# Patient Record
Sex: Female | Born: 1955 | Race: White | Hispanic: No | Marital: Married | Smoking: Former smoker
Health system: Southern US, Community
[De-identification: ages and names within clinical notes are randomized; demographics above are authoritative.]

## PROBLEM LIST (undated history)

## (undated) DIAGNOSIS — E785 Hyperlipidemia, unspecified: Secondary | ICD-10-CM

## (undated) DIAGNOSIS — T7840XA Allergy, unspecified, initial encounter: Secondary | ICD-10-CM

## (undated) DIAGNOSIS — E079 Disorder of thyroid, unspecified: Secondary | ICD-10-CM

## (undated) HISTORY — DX: Allergy, unspecified, initial encounter: T78.40XA

## (undated) HISTORY — PX: HAMMER TOE SURGERY: SHX385

## (undated) HISTORY — DX: Hyperlipidemia, unspecified: E78.5

## (undated) HISTORY — PX: EYE SURGERY: SHX253

## (undated) HISTORY — PX: OTHER SURGICAL HISTORY: SHX169

## (undated) HISTORY — PX: TUBAL LIGATION: SHX77

## (undated) HISTORY — DX: Disorder of thyroid, unspecified: E07.9

---

## 1998-10-11 ENCOUNTER — Other Ambulatory Visit: Admission: RE | Admit: 1998-10-11 | Discharge: 1998-10-11 | Payer: Self-pay | Admitting: Family Medicine

## 1998-11-09 ENCOUNTER — Other Ambulatory Visit: Admission: RE | Admit: 1998-11-09 | Discharge: 1998-11-09 | Payer: Self-pay

## 2000-01-31 ENCOUNTER — Other Ambulatory Visit: Admission: RE | Admit: 2000-01-31 | Discharge: 2000-01-31 | Payer: Self-pay | Admitting: Family Medicine

## 2001-02-23 ENCOUNTER — Other Ambulatory Visit: Admission: RE | Admit: 2001-02-23 | Discharge: 2001-02-23 | Payer: Self-pay | Admitting: Family Medicine

## 2002-04-12 ENCOUNTER — Other Ambulatory Visit: Admission: RE | Admit: 2002-04-12 | Discharge: 2002-04-12 | Payer: Self-pay | Admitting: Family Medicine

## 2003-04-25 ENCOUNTER — Other Ambulatory Visit: Admission: RE | Admit: 2003-04-25 | Discharge: 2003-04-25 | Payer: Self-pay | Admitting: Family Medicine

## 2003-08-21 ENCOUNTER — Other Ambulatory Visit: Admission: RE | Admit: 2003-08-21 | Discharge: 2003-08-21 | Payer: Self-pay | Admitting: Dermatology

## 2004-06-04 ENCOUNTER — Other Ambulatory Visit: Admission: RE | Admit: 2004-06-04 | Discharge: 2004-06-04 | Payer: Self-pay | Admitting: Family Medicine

## 2005-07-01 ENCOUNTER — Other Ambulatory Visit: Admission: RE | Admit: 2005-07-01 | Discharge: 2005-07-01 | Payer: Self-pay | Admitting: Family Medicine

## 2006-07-07 ENCOUNTER — Other Ambulatory Visit: Admission: RE | Admit: 2006-07-07 | Discharge: 2006-07-07 | Payer: Self-pay | Admitting: Family Medicine

## 2013-03-08 ENCOUNTER — Telehealth: Payer: Self-pay | Admitting: Nurse Practitioner

## 2013-03-08 DIAGNOSIS — E785 Hyperlipidemia, unspecified: Secondary | ICD-10-CM

## 2013-03-08 DIAGNOSIS — E039 Hypothyroidism, unspecified: Secondary | ICD-10-CM

## 2013-03-08 NOTE — Telephone Encounter (Signed)
Lab appt made/ orders in for 03/30/13 labs per mmm/jhb

## 2013-03-11 ENCOUNTER — Encounter: Payer: Self-pay | Admitting: Family Medicine

## 2013-03-30 ENCOUNTER — Other Ambulatory Visit (INDEPENDENT_AMBULATORY_CARE_PROVIDER_SITE_OTHER): Payer: BC Managed Care – PPO

## 2013-03-30 DIAGNOSIS — E039 Hypothyroidism, unspecified: Secondary | ICD-10-CM

## 2013-03-30 DIAGNOSIS — E785 Hyperlipidemia, unspecified: Secondary | ICD-10-CM

## 2013-03-30 LAB — COMPLETE METABOLIC PANEL WITH GFR
ALT: 11 U/L (ref 0–35)
AST: 19 U/L (ref 0–37)
Albumin: 4.4 g/dL (ref 3.5–5.2)
Alkaline Phosphatase: 57 U/L (ref 39–117)
Calcium: 9.7 mg/dL (ref 8.4–10.5)
Chloride: 105 mEq/L (ref 96–112)
Creat: 0.87 mg/dL (ref 0.50–1.10)
Potassium: 4.3 mEq/L (ref 3.5–5.3)

## 2013-03-31 LAB — NMR LIPOPROFILE WITH LIPIDS
Cholesterol, Total: 202 mg/dL — ABNORMAL HIGH (ref <200)
HDL Particle Number: 42 umol/L (ref >=30.5)
LDL (calc): 128 mg/dL — ABNORMAL HIGH (ref <100)
LP-IR Score: 37 (ref <=45)
Small LDL Particle Number: 567 nmol/L — ABNORMAL HIGH (ref <=527)
Triglycerides: 57 mg/dL (ref <150)

## 2013-04-01 NOTE — Progress Notes (Signed)
Patient came in for labs only.

## 2013-04-04 ENCOUNTER — Ambulatory Visit (INDEPENDENT_AMBULATORY_CARE_PROVIDER_SITE_OTHER): Payer: BC Managed Care – PPO | Admitting: Nurse Practitioner

## 2013-04-04 ENCOUNTER — Encounter: Payer: Self-pay | Admitting: Nurse Practitioner

## 2013-04-04 VITALS — BP 109/55 | HR 61 | Temp 98.1°F | Ht 69.0 in | Wt 190.0 lb

## 2013-04-04 DIAGNOSIS — J309 Allergic rhinitis, unspecified: Secondary | ICD-10-CM | POA: Insufficient documentation

## 2013-04-04 DIAGNOSIS — E039 Hypothyroidism, unspecified: Secondary | ICD-10-CM

## 2013-04-04 DIAGNOSIS — K219 Gastro-esophageal reflux disease without esophagitis: Secondary | ICD-10-CM

## 2013-04-04 DIAGNOSIS — E785 Hyperlipidemia, unspecified: Secondary | ICD-10-CM

## 2013-04-04 MED ORDER — PITAVASTATIN CALCIUM 2 MG PO TABS: 2.0000 mg | ORAL_TABLET | Freq: Every day | ORAL | Status: DC

## 2013-04-04 NOTE — Progress Notes (Signed)
  Subjective:    Patient ID: Melissa Gilbert, female    DOB: Feb 13, 1956, 57 y.o.   MRN: [id number]  Hyperlipidemia This is a chronic problem. The current episode started more than 1 year ago. The problem is controlled. Recent lipid tests were reviewed and are normal. There are no known factors aggravating her hyperlipidemia. Pertinent negatives include no chest pain. She is currently on no antihyperlipidemic treatment. The current treatment provides significant improvement of lipids. There are no compliance problems.   Hypertension This is a chronic problem. The current episode started more than 1 year ago. The problem is unchanged. The problem is controlled. Pertinent negatives include no blurred vision, chest pain, headaches, malaise/fatigue, palpitations, peripheral edema or sweats. There are no associated agents to hypertension. Risk factors for coronary artery disease include dyslipidemia. Past treatments include nothing. The current treatment provides significant improvement. There are no compliance problems.   HYpothyroidism Levothyroxin . No C/O fatigue   Review of Systems  Constitutional: Negative for malaise/fatigue.  Eyes: Negative for blurred vision.  Cardiovascular: Negative for chest pain and palpitations.  Neurological: Negative for headaches.  All other systems reviewed and are negative.       Objective:   Physical Exam  Constitutional: She is oriented to person, place, and time. She appears well-developed and well-nourished.  HENT:  Nose: Nose normal.  Mouth/Throat: Oropharynx is clear and moist.  Eyes: EOM are normal.  Neck: Trachea normal, normal range of motion and full passive range of motion without pain. Neck supple. No JVD present. Carotid bruit is not present. No thyromegaly present.  Cardiovascular: Normal rate, regular rhythm, normal heart sounds and intact distal pulses.  Exam reveals no gallop and no friction rub.   No murmur heard. Pulmonary/Chest:  Effort normal and breath sounds normal.  Abdominal: Soft. Bowel sounds are normal. She exhibits no distension and no mass. There is no tenderness.  Musculoskeletal: Normal range of motion.  Lymphadenopathy:    She has no cervical adenopathy.  Neurological: She is alert and oriented to person, place, and time. She has normal reflexes.  Skin: Skin is warm and dry.  Psychiatric: She has a normal mood and affect. Her behavior is normal. Judgment and thought content normal.  BP 109/55  Pulse 61  Temp(Src) 98.1 F (36.7 C) (Oral)  Ht 5\' 9"  (1.753 m)  Wt 190 lb (86.183 kg)  BMI 28.05 kg/m2         Assessment & Plan:  1. Hypothyroidism Contine Levothyroxin as Rx  2. Hyperlipidemia Low fat diet and exercise - Pitavastatin Calcium (LIVALO) 2 MG TABS; Take 1 tablet (2 mg total) by mouth daily.  Dispense: 90 tablet; Refill: 1  3. GERD (gastroesophageal reflux disease) Do not eat 2 hours prior to bedtime  4. Allergic rhinitis Meds as needed Avoid allergens Labs reviewed at appointment Mary-Margaret Daphine Deutscher, FNP

## 2013-04-04 NOTE — Patient Instructions (Addendum)

## 2013-05-11 ENCOUNTER — Other Ambulatory Visit: Payer: Self-pay | Admitting: Nurse Practitioner

## 2013-05-11 ENCOUNTER — Telehealth: Payer: Self-pay | Admitting: Nurse Practitioner

## 2013-05-11 MED ORDER — LEVOTHYROXINE SODIUM 125 MCG PO TABS: 125.0000 ug | ORAL_TABLET | Freq: Every day | ORAL | Status: DC

## 2013-05-11 NOTE — Telephone Encounter (Signed)
Will do refill but nTBS in next 2 months to repeat labs

## 2013-05-11 NOTE — Telephone Encounter (Signed)
Detailed message left that rx sent to pharmacy and that she will need to be seen in 2 months for labs and follow up

## 2013-08-18 ENCOUNTER — Ambulatory Visit (INDEPENDENT_AMBULATORY_CARE_PROVIDER_SITE_OTHER): Payer: BC Managed Care – PPO | Admitting: Nurse Practitioner

## 2013-08-18 ENCOUNTER — Telehealth: Payer: Self-pay | Admitting: Nurse Practitioner

## 2013-08-18 ENCOUNTER — Encounter: Payer: Self-pay | Admitting: Nurse Practitioner

## 2013-08-18 VITALS — BP 107/52 | HR 62 | Temp 97.3°F | Ht 69.5 in | Wt 202.0 lb

## 2013-08-18 DIAGNOSIS — E039 Hypothyroidism, unspecified: Secondary | ICD-10-CM

## 2013-08-18 DIAGNOSIS — E785 Hyperlipidemia, unspecified: Secondary | ICD-10-CM

## 2013-08-18 DIAGNOSIS — N390 Urinary tract infection, site not specified: Secondary | ICD-10-CM

## 2013-08-18 LAB — POCT UA - MICROSCOPIC ONLY
Casts, Ur, LPF, POC: NEGATIVE
Crystals, Ur, HPF, POC: NEGATIVE
Yeast, UA: NEGATIVE

## 2013-08-18 MED ORDER — PITAVASTATIN CALCIUM 2 MG PO TABS: 2.0000 mg | ORAL_TABLET | Freq: Every day | ORAL | Status: DC

## 2013-08-18 MED ORDER — LEVOTHYROXINE SODIUM 125 MCG PO TABS: 125.0000 ug | ORAL_TABLET | Freq: Every day | ORAL | Status: DC

## 2013-08-18 MED ORDER — CIPROFLOXACIN HCL 500 MG PO TABS: 500.0000 mg | ORAL_TABLET | Freq: Two times a day (BID) | ORAL | Status: DC

## 2013-08-18 NOTE — Telephone Encounter (Signed)
APPT MADE

## 2013-08-18 NOTE — Patient Instructions (Signed)
Urinary Tract Infection  Urinary tract infections (UTIs) can develop anywhere along your urinary tract. Your urinary tract is your body's drainage system for removing wastes and extra water. Your urinary tract includes two kidneys, two ureters, a bladder, and a urethra. Your kidneys are a pair of bean-shaped organs. Each kidney is about the size of your fist. They are located below your ribs, one on each side of your spine.  CAUSES  Infections are caused by microbes, which are microscopic organisms, including fungi, viruses, and bacteria. These organisms are so small that they can only be seen through a microscope. Bacteria are the microbes that most commonly cause UTIs.  SYMPTOMS   Symptoms of UTIs may vary by age and gender of the patient and by the location of the infection. Symptoms in young women typically include a frequent and intense urge to urinate and a painful, burning feeling in the bladder or urethra during urination. Older women and men are more likely to be tired, shaky, and weak and have muscle aches and abdominal pain. A fever may mean the infection is in your kidneys. Other symptoms of a kidney infection include pain in your back or sides below the ribs, nausea, and vomiting.  DIAGNOSIS  To diagnose a UTI, your caregiver will ask you about your symptoms. Your caregiver also will ask to provide a urine sample. The urine sample will be tested for bacteria and white blood cells. White blood cells are made by your body to help fight infection.  TREATMENT   Typically, UTIs can be treated with medication. Because most UTIs are caused by a bacterial infection, they usually can be treated with the use of antibiotics. The choice of antibiotic and length of treatment depend on your symptoms and the type of bacteria causing your infection.  HOME CARE INSTRUCTIONS   If you were prescribed antibiotics, take them exactly as your caregiver instructs you. Finish the medication even if you feel better after you  have only taken some of the medication.   Drink enough water and fluids to keep your urine clear or pale yellow.   Avoid caffeine, tea, and carbonated beverages. They tend to irritate your bladder.   Empty your bladder often. Avoid holding urine for long periods of time.   Empty your bladder before and after sexual intercourse.   After a bowel movement, women should cleanse from front to back. Use each tissue only once.  SEEK MEDICAL CARE IF:    You have back pain.   You develop a fever.   Your symptoms do not begin to resolve within 3 days.  SEEK IMMEDIATE MEDICAL CARE IF:    You have severe back pain or lower abdominal pain.   You develop chills.   You have nausea or vomiting.   You have continued burning or discomfort with urination.  MAKE SURE YOU:    Understand these instructions.   Will watch your condition.   Will get help right away if you are not doing well or get worse.  Document Released: 08/27/2005 Document Revised: 05/18/2012 Document Reviewed: 12/26/2011  [employer] Patient Information 2014 [employer].

## 2013-08-18 NOTE — Progress Notes (Signed)
  Subjective:    Patient ID: MERELIN HUMAN, female    DOB: 1956-01-20, 57 y.o.   MRN: [id number]  Urinary Tract Infection  This is a new problem. The current episode started yesterday. The problem occurs every urination. The problem has been waxing and waning. The quality of the pain is described as burning. The pain is at a severity of 5/10. The pain is mild. There has been no fever. She is sexually active. There is no history of pyelonephritis. Associated symptoms include frequency, hesitancy and urgency. Pertinent negatives include no chills, flank pain or vomiting. Treatments tried: AZO OTC. The treatment provided mild relief.      Review of Systems  Constitutional: Negative for chills.  Gastrointestinal: Negative for vomiting.  Genitourinary: Positive for hesitancy, urgency and frequency. Negative for flank pain.  All other systems reviewed and are negative.       Objective:   Physical Exam  Constitutional: She appears well-developed and well-nourished.  Cardiovascular: Normal rate, regular rhythm and normal heart sounds.   Pulmonary/Chest: Effort normal and breath sounds normal.  Abdominal: Soft. Bowel sounds are normal. There is no tenderness.  Genitourinary:  No CVA tenderness   BP 107/52  Pulse 62  Temp(Src) 97.3 F (36.3 C) (Oral)  Ht 5' 9.5" (1.765 m)  Wt 202 lb (91.627 kg)  BMI 29.41 kg/m2         Assessment & Plan:  1. UTI (urinary tract infection) Force fluids - POCT UA - Microscopic Only - POCT urinalysis dipstick - ciprofloxacin (CIPRO) 500 MG tablet; Take 1 tablet (500 mg total) by mouth 2 (two) times daily.  Dispense: 10 tablet; Refill: 0  2. Hyperlipidemia Low fat diet and exercise - Pitavastatin Calcium (LIVALO) 2 MG TABS; Take 1 tablet (2 mg total) by mouth daily.  Dispense: 90 tablet; Refill: 1  3. Hypothyroidism  - levothyroxine (SYNTHROID, LEVOTHROID) 125 MCG tablet; Take 1 tablet (125 mcg total) by mouth daily before breakfast.  Dispense:  30 tablet; Refill: 2  Mary-Margaret Daphine Deutscher, FNP

## 2013-09-26 ENCOUNTER — Other Ambulatory Visit: Payer: Self-pay | Admitting: Nurse Practitioner

## 2013-09-26 ENCOUNTER — Encounter (INDEPENDENT_AMBULATORY_CARE_PROVIDER_SITE_OTHER): Payer: Self-pay

## 2013-09-26 ENCOUNTER — Other Ambulatory Visit (INDEPENDENT_AMBULATORY_CARE_PROVIDER_SITE_OTHER): Payer: BC Managed Care – PPO

## 2013-09-26 DIAGNOSIS — E039 Hypothyroidism, unspecified: Secondary | ICD-10-CM

## 2013-09-26 DIAGNOSIS — E785 Hyperlipidemia, unspecified: Secondary | ICD-10-CM

## 2013-09-27 LAB — CMP14+EGFR
ALT: 11 IU/L (ref 0–32)
Albumin/Globulin Ratio: 2.6 — ABNORMAL HIGH (ref 1.1–2.5)
Albumin: 4.5 g/dL (ref 3.5–5.5)
Alkaline Phosphatase: 70 IU/L (ref 39–117)
BUN/Creatinine Ratio: 16 (ref 9–23)
Chloride: 103 mmol/L (ref 97–108)
GFR calc Af Amer: 85 mL/min/{1.73_m2} (ref >59)
GFR calc non Af Amer: 74 mL/min/{1.73_m2} (ref >59)
Potassium: 4.5 mmol/L (ref 3.5–5.2)
Total Bilirubin: 0.2 mg/dL (ref 0.0–1.2)
Total Protein: 6.2 g/dL (ref 6.0–8.5)

## 2013-09-27 LAB — NMR, LIPOPROFILE
Cholesterol: 184 mg/dL (ref <200)
LDL Particle Number: 1298 nmol/L — ABNORMAL HIGH (ref <1000)
LDL Size: 20.6 nm (ref >20.5)
LP-IR Score: 38 (ref <=45)
Small LDL Particle Number: 616 nmol/L — ABNORMAL HIGH (ref <=527)
Triglycerides by NMR: 63 mg/dL (ref <150)

## 2013-09-27 LAB — THYROID PANEL WITH TSH
Free Thyroxine Index: 3 (ref 1.2–4.9)
TSH: 4.21 u[IU]/mL (ref 0.450–4.500)

## 2013-09-29 NOTE — Progress Notes (Signed)
Patient came in for labs only.

## 2013-09-30 ENCOUNTER — Encounter: Payer: Self-pay | Admitting: Nurse Practitioner

## 2013-09-30 ENCOUNTER — Ambulatory Visit (INDEPENDENT_AMBULATORY_CARE_PROVIDER_SITE_OTHER): Payer: BC Managed Care – PPO | Admitting: Nurse Practitioner

## 2013-09-30 ENCOUNTER — Telehealth: Payer: Self-pay | Admitting: Nurse Practitioner

## 2013-09-30 VITALS — BP 108/52 | HR 61 | Temp 97.2°F | Wt 198.0 lb

## 2013-09-30 DIAGNOSIS — R350 Frequency of micturition: Secondary | ICD-10-CM

## 2013-09-30 DIAGNOSIS — N390 Urinary tract infection, site not specified: Secondary | ICD-10-CM

## 2013-09-30 DIAGNOSIS — IMO0001 Reserved for inherently not codable concepts without codable children: Secondary | ICD-10-CM

## 2013-09-30 LAB — POCT UA - MICROSCOPIC ONLY
Casts, Ur, LPF, POC: NEGATIVE
Crystals, Ur, HPF, POC: NEGATIVE
Yeast, UA: NEGATIVE

## 2013-09-30 LAB — POCT URINALYSIS DIPSTICK
Bilirubin, UA: NEGATIVE
Glucose, UA: NEGATIVE
Ketones, UA: NEGATIVE
Spec Grav, UA: 1.01

## 2013-09-30 MED ORDER — NITROFURANTOIN MONOHYD MACRO 100 MG PO CAPS: 100.0000 mg | ORAL_CAPSULE | Freq: Two times a day (BID) | ORAL | Status: DC

## 2013-09-30 NOTE — Progress Notes (Signed)
  Subjective:    Patient ID: Melissa Gilbert, female    DOB: 1956-09-20, 57 y.o.   MRN: [id number]  Urinary Tract Infection  This is a new problem. The current episode started in the past 7 days. Episode frequency: dysuria. The problem has been gradually worsening. The quality of the pain is described as aching and burning. The pain is at a severity of 5/10. The pain is mild. There has been no fever. The fever has been present for 1 - 2 days. She is sexually active. There is no history of pyelonephritis. Associated symptoms include frequency and urgency. Pertinent negatives include no possible pregnancy. She has tried nothing for the symptoms.      Review of Systems  Genitourinary: Positive for urgency and frequency.  All other systems reviewed and are negative.       Objective:   Physical Exam  Constitutional: She appears well-developed and well-nourished.  Cardiovascular: Normal rate, regular rhythm and normal heart sounds.   Pulmonary/Chest: Effort normal and breath sounds normal.  Abdominal: Soft. Bowel sounds are normal.  Genitourinary:  No CvA tenderness   BP 108/52  Pulse 61  Temp(Src) 97.2 F (36.2 C) (Oral)  Wt 198 lb (89.812 kg)  BMI 28.83 kg/m2         Assessment & Plan:   1. Frequency   2. UTI (urinary tract infection)    Orders Placed This Encounter  Procedures  . POCT urinalysis dipstick  . POCT UA - Microscopic Only   Meds ordered this encounter  Medications  . nitrofurantoin, macrocrystal-monohydrate, (MACROBID) 100 MG capsule    Sig: Take 1 capsule (100 mg total) by mouth 2 (two) times daily.    Dispense:  14 capsule    Refill:  1    Order Specific Question:  Supervising Provider    Answer:  Ernestina Penna [[id number]]   Force fluids AZO OTC as needed  Mary-Margaret Daphine Deutscher, FNP

## 2013-09-30 NOTE — Patient Instructions (Signed)
Urinary Tract Infection  Urinary tract infections (UTIs) can develop anywhere along your urinary tract. Your urinary tract is your body's drainage system for removing wastes and extra water. Your urinary tract includes two kidneys, two ureters, a bladder, and a urethra. Your kidneys are a pair of bean-shaped organs. Each kidney is about the size of your fist. They are located below your ribs, one on each side of your spine.  CAUSES  Infections are caused by microbes, which are microscopic organisms, including fungi, viruses, and bacteria. These organisms are so small that they can only be seen through a microscope. Bacteria are the microbes that most commonly cause UTIs.  SYMPTOMS   Symptoms of UTIs may vary by age and gender of the patient and by the location of the infection. Symptoms in young women typically include a frequent and intense urge to urinate and a painful, burning feeling in the bladder or urethra during urination. Older women and men are more likely to be tired, shaky, and weak and have muscle aches and abdominal pain. A fever may mean the infection is in your kidneys. Other symptoms of a kidney infection include pain in your back or sides below the ribs, nausea, and vomiting.  DIAGNOSIS  To diagnose a UTI, your caregiver will ask you about your symptoms. Your caregiver also will ask to provide a urine sample. The urine sample will be tested for bacteria and white blood cells. White blood cells are made by your body to help fight infection.  TREATMENT   Typically, UTIs can be treated with medication. Because most UTIs are caused by a bacterial infection, they usually can be treated with the use of antibiotics. The choice of antibiotic and length of treatment depend on your symptoms and the type of bacteria causing your infection.  HOME CARE INSTRUCTIONS   If you were prescribed antibiotics, take them exactly as your caregiver instructs you. Finish the medication even if you feel better after you  have only taken some of the medication.   Drink enough water and fluids to keep your urine clear or pale yellow.   Avoid caffeine, tea, and carbonated beverages. They tend to irritate your bladder.   Empty your bladder often. Avoid holding urine for long periods of time.   Empty your bladder before and after sexual intercourse.   After a bowel movement, women should cleanse from front to back. Use each tissue only once.  SEEK MEDICAL CARE IF:    You have back pain.   You develop a fever.   Your symptoms do not begin to resolve within 3 days.  SEEK IMMEDIATE MEDICAL CARE IF:    You have severe back pain or lower abdominal pain.   You develop chills.   You have nausea or vomiting.   You have continued burning or discomfort with urination.  MAKE SURE YOU:    Understand these instructions.   Will watch your condition.   Will get help right away if you are not doing well or get worse.  Document Released: 08/27/2005 Document Revised: 05/18/2012 Document Reviewed: 12/26/2011  [employer] Patient Information 2014 [employer].

## 2013-09-30 NOTE — Telephone Encounter (Signed)
appt scheduled for UTI

## 2013-10-03 ENCOUNTER — Telehealth: Payer: Self-pay | Admitting: Nurse Practitioner

## 2013-10-03 DIAGNOSIS — E785 Hyperlipidemia, unspecified: Secondary | ICD-10-CM

## 2013-10-04 MED ORDER — PITAVASTATIN CALCIUM 2 MG PO TABS: 2.0000 mg | ORAL_TABLET | Freq: Every day | ORAL | Status: DC

## 2013-10-04 NOTE — Telephone Encounter (Signed)
rx sent to pharmacy

## 2013-10-05 ENCOUNTER — Ambulatory Visit: Payer: BC Managed Care – PPO | Admitting: Nurse Practitioner

## 2013-10-05 NOTE — Telephone Encounter (Signed)
Aware. 

## 2013-11-16 ENCOUNTER — Other Ambulatory Visit: Payer: Self-pay | Admitting: Nurse Practitioner

## 2014-04-15 ENCOUNTER — Other Ambulatory Visit: Payer: Self-pay | Admitting: Nurse Practitioner

## 2014-04-17 ENCOUNTER — Telehealth: Payer: Self-pay | Admitting: Nurse Practitioner

## 2014-04-17 ENCOUNTER — Other Ambulatory Visit: Payer: Self-pay | Admitting: *Deleted

## 2014-04-17 DIAGNOSIS — E785 Hyperlipidemia, unspecified: Secondary | ICD-10-CM

## 2014-04-17 MED ORDER — PITAVASTATIN CALCIUM 2 MG PO TABS: 2.0000 mg | ORAL_TABLET | Freq: Every day | ORAL | Status: DC

## 2014-04-17 NOTE — Telephone Encounter (Signed)
It will be ok to wait until seen for refill

## 2014-04-17 NOTE — Telephone Encounter (Signed)
Patient NTBS for follow up and lab work For refill

## 2014-04-17 NOTE — Telephone Encounter (Signed)
Last seen and last lipids 10/14  MMM

## 2014-04-18 NOTE — Telephone Encounter (Signed)
Patient aware.

## 2014-04-20 ENCOUNTER — Ambulatory Visit (INDEPENDENT_AMBULATORY_CARE_PROVIDER_SITE_OTHER): Payer: BC Managed Care – PPO | Admitting: Nurse Practitioner

## 2014-04-20 ENCOUNTER — Encounter: Payer: Self-pay | Admitting: Nurse Practitioner

## 2014-04-20 VITALS — BP 101/63 | HR 70 | Temp 99.1°F | Ht 69.5 in | Wt 200.4 lb

## 2014-04-20 DIAGNOSIS — K219 Gastro-esophageal reflux disease without esophagitis: Secondary | ICD-10-CM

## 2014-04-20 DIAGNOSIS — E785 Hyperlipidemia, unspecified: Secondary | ICD-10-CM

## 2014-04-20 DIAGNOSIS — E039 Hypothyroidism, unspecified: Secondary | ICD-10-CM

## 2014-04-20 MED ORDER — LEVOTHYROXINE SODIUM 125 MCG PO TABS: ORAL_TABLET | ORAL | Status: DC

## 2014-04-20 MED ORDER — PITAVASTATIN CALCIUM 2 MG PO TABS: 2.0000 mg | ORAL_TABLET | Freq: Every day | ORAL | Status: DC

## 2014-04-20 NOTE — Progress Notes (Signed)
  Subjective:    Patient ID: Melissa Gilbert, female    DOB: December 10, 1955, 58 y.o.   MRN: [id number]  Patient here today for follow up of chronic medical problems. No complaints.  Hyperlipidemia This is a chronic problem. The current episode started more than 1 year ago. The problem is controlled. Recent lipid tests were reviewed and are normal. There are no known factors aggravating her hyperlipidemia. She is currently on no antihyperlipidemic treatment. The current treatment provides significant improvement of lipids. There are no compliance problems.   HYpothyroidism Levothyroxin 146mcg. No C/O fatigue   Review of Systems  All other systems reviewed and are negative.      Objective:   Physical Exam  Constitutional: She is oriented to person, place, and time. She appears well-developed and well-nourished.  HENT:  Nose: Nose normal.  Mouth/Throat: Oropharynx is clear and moist.  Eyes: EOM are normal.  Neck: Trachea normal, normal range of motion and full passive range of motion without pain. Neck supple. No JVD present. Carotid bruit is not present. No thyromegaly present.  Cardiovascular: Normal rate, regular rhythm, normal heart sounds and intact distal pulses.  Exam reveals no gallop and no friction rub.   No murmur heard. Pulmonary/Chest: Effort normal and breath sounds normal.  Abdominal: Soft. Bowel sounds are normal. She exhibits no distension and no mass. There is no tenderness.  Musculoskeletal: Normal range of motion.  Lymphadenopathy:    She has no cervical adenopathy.  Neurological: She is alert and oriented to person, place, and time. She has normal reflexes.  Skin: Skin is warm and dry.  Psychiatric: She has a normal mood and affect. Her behavior is normal. Judgment and thought content normal.  BP 101/63  Pulse 70  Temp(Src) 99.1 F (37.3 C) (Oral)  Ht 5' 9.5" (1.765 m)  Wt 200 lb 6.4 oz (90.901 kg)  BMI 29.18 kg/m2         Assessment & Plan:   1.  Hypothyroidism   2. Hyperlipidemia   3. GERD (gastroesophageal reflux disease)    Orders Placed This Encounter  Procedures  . CMP14+EGFR  . NMR, lipoprofile  . Thyroid Panel With TSH   Meds ordered this encounter  Medications  . levothyroxine (SYNTHROID, LEVOTHROID) 125 MCG tablet    Sig: TAKE 1 TABLET DAILY BEFORE BREAKFAST    Dispense:  30 tablet    Refill:  9    Order Specific Question:  Supervising Provider    Answer:  Melissa Gilbert [[id number]]  . Pitavastatin Calcium (LIVALO) 2 MG TABS    Sig: Take 1 tablet (2 mg total) by mouth daily.    Dispense:  90 tablet    Refill:  0    Order Specific Question:  Supervising Provider    Answer:  Melissa Gilbert [[id number]]    Labs pending Health maintenance reviewed Diet and exercise encouraged Continue all meds Follow up  In 6 months    [place], FNP

## 2014-04-20 NOTE — Patient Instructions (Signed)

## 2014-04-21 LAB — NMR, LIPOPROFILE
CHOLESTEROL: 207 mg/dL — AB (ref 100–199)
HDL Cholesterol by NMR: 57 mg/dL (ref >39)
HDL PARTICLE NUMBER: 43 umol/L (ref >=30.5)
LDL Particle Number: 1225 nmol/L — ABNORMAL HIGH (ref <1000)
LDL Size: 21.1 nm (ref >20.5)
LDLC SERPL CALC-MCNC: 112 mg/dL — AB (ref 0–99)
LP-IR Score: 44 (ref <=45)
Small LDL Particle Number: 368 nmol/L (ref <=527)
Triglycerides by NMR: 189 mg/dL — ABNORMAL HIGH (ref 0–149)

## 2014-04-21 LAB — CMP14+EGFR
ALBUMIN: 4.5 g/dL (ref 3.5–5.5)
ALK PHOS: 74 IU/L (ref 39–117)
ALT: 13 IU/L (ref 0–32)
AST: 21 IU/L (ref 0–40)
Albumin/Globulin Ratio: 2.5 (ref 1.1–2.5)
BUN / CREAT RATIO: 12 (ref 9–23)
BUN: 10 mg/dL (ref 6–24)
CO2: 26 mmol/L (ref 18–29)
CREATININE: 0.83 mg/dL (ref 0.57–1.00)
Calcium: 9.3 mg/dL (ref 8.7–10.2)
Chloride: 102 mmol/L (ref 97–108)
GFR calc Af Amer: 92 mL/min/{1.73_m2} (ref >59)
GFR calc non Af Amer: 80 mL/min/{1.73_m2} (ref >59)
GLOBULIN, TOTAL: 1.8 g/dL (ref 1.5–4.5)
Glucose: 103 mg/dL — ABNORMAL HIGH (ref 65–99)
Potassium: 4.2 mmol/L (ref 3.5–5.2)
Sodium: 142 mmol/L (ref 134–144)
Total Bilirubin: <0.2 mg/dL (ref 0.0–1.2)
Total Protein: 6.3 g/dL (ref 6.0–8.5)

## 2014-04-21 LAB — THYROID PANEL WITH TSH
FREE THYROXINE INDEX: 2.5 (ref 1.2–4.9)
T3 Uptake Ratio: 26 % (ref 24–39)
T4 TOTAL: 9.5 ug/dL (ref 4.5–12.0)
TSH: 0.91 u[IU]/mL (ref 0.450–4.500)

## 2014-07-27 ENCOUNTER — Ambulatory Visit (INDEPENDENT_AMBULATORY_CARE_PROVIDER_SITE_OTHER): Payer: BC Managed Care – PPO | Admitting: Nurse Practitioner

## 2014-07-27 ENCOUNTER — Encounter: Payer: Self-pay | Admitting: Nurse Practitioner

## 2014-07-27 VITALS — BP 125/61 | HR 70 | Temp 97.1°F | Ht 69.0 in | Wt 203.0 lb

## 2014-07-27 DIAGNOSIS — E034 Atrophy of thyroid (acquired): Secondary | ICD-10-CM

## 2014-07-27 DIAGNOSIS — K219 Gastro-esophageal reflux disease without esophagitis: Secondary | ICD-10-CM

## 2014-07-27 DIAGNOSIS — E0789 Other specified disorders of thyroid: Secondary | ICD-10-CM

## 2014-07-27 DIAGNOSIS — E785 Hyperlipidemia, unspecified: Secondary | ICD-10-CM

## 2014-07-27 DIAGNOSIS — E038 Other specified hypothyroidism: Secondary | ICD-10-CM

## 2014-07-27 DIAGNOSIS — J301 Allergic rhinitis due to pollen: Secondary | ICD-10-CM

## 2014-07-27 MED ORDER — FEXOFENADINE HCL 180 MG PO TABS: 180.0000 mg | ORAL_TABLET | Freq: Every day | ORAL | Status: DC

## 2014-07-27 MED ORDER — OMEPRAZOLE 40 MG PO CPDR: 40.0000 mg | DELAYED_RELEASE_CAPSULE | Freq: Every day | ORAL | Status: DC

## 2014-07-27 MED ORDER — LEVOTHYROXINE SODIUM 125 MCG PO TABS: ORAL_TABLET | ORAL | Status: DC

## 2014-07-27 MED ORDER — PITAVASTATIN CALCIUM 2 MG PO TABS: 2.0000 mg | ORAL_TABLET | Freq: Every day | ORAL | Status: DC

## 2014-07-27 NOTE — Progress Notes (Signed)
Subjective:    Patient ID: Melissa Gilbert, female    DOB: 1956/03/08, 58 y.o.   MRN: [id number]  Patient here today for follow up of chronic medical problems. No complaints.  Hyperlipidemia This is a chronic problem. The current episode started more than 1 year ago. The problem is controlled. Recent lipid tests were reviewed and are normal. There are no known factors aggravating her hyperlipidemia. She is currently on no antihyperlipidemic treatment. The current treatment provides significant improvement of lipids. There are no compliance problems.   HYpothyroidism Levothyroxin 142mg. No C/O fatigue GJerrye BushyHas been taking omeprazole OTC which is working great Allergic rhinitis Allegra working well- no complaints  Review of Systems  All other systems reviewed and are negative.      Objective:   Physical Exam  Constitutional: She is oriented to person, place, and time. She appears well-developed and well-nourished.  HENT:  Nose: Nose normal.  Mouth/Throat: Oropharynx is clear and moist.  Eyes: EOM are normal.  Neck: Trachea normal, normal range of motion and full passive range of motion without pain. Neck supple. No JVD present. Carotid bruit is not present. No thyromegaly present.  Cardiovascular: Normal rate, regular rhythm, normal heart sounds and intact distal pulses.  Exam reveals no gallop and no friction rub.   No murmur heard. Pulmonary/Chest: Effort normal and breath sounds normal.  Abdominal: Soft. Bowel sounds are normal. She exhibits no distension and no mass. There is no tenderness.  Musculoskeletal: Normal range of motion.  Lymphadenopathy:    She has no cervical adenopathy.  Neurological: She is alert and oriented to person, place, and time. She has normal reflexes.  Skin: Skin is warm and dry.  Psychiatric: She has a normal mood and affect. Her behavior is normal. Judgment and thought content normal.  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 Hyperlipidemia   3. Gastroesophageal reflux disease without esophagitis   4. Allergic rhinitis due to pollen    Orders Placed This Encounter  Procedures  . CMP14+EGFR  . NMR, lipoprofile  . Thyroid Panel With TSH   Meds ordered this encounter  Medications  . omeprazole (PRILOSEC) 40 MG capsule    Sig: Take 1 capsule (40 mg total) by mouth daily.    Dispense:  30 capsule    Refill:  3    Order Specific Question:  Supervising Provider    Answer:  MChipper Herb[[id number]]  . fexofenadine (ALLEGRA) 180 MG tablet    Sig: Take 1 tablet (180 mg total) by mouth daily.    Dispense:  30 tablet    Refill:  5    Order Specific Question:  Supervising Provider    Answer:  MChipper Herb[[id number]]  . levothyroxine (SYNTHROID, LEVOTHROID) 125 MCG tablet    Sig: TAKE 1 TABLET DAILY BEFORE BREAKFAST    Dispense:  30 tablet    Refill:  9    Order Specific Question:  Supervising Provider    Answer:  MChipper Herb[[id number]]  . Pitavastatin Calcium (LIVALO) 2 MG TABS    Sig: Take 1 tablet (2 mg total) by mouth daily.    Dispense:  90 tablet    Refill:  0    Order Specific  Question:  Supervising Provider    Answer:  Chipper Herb [place] pending Health maintenance reviewed Diet and exercise encouraged Continue all meds Follow up  In 6 months   [place], FNP

## 2014-07-27 NOTE — Patient Instructions (Signed)

## 2014-07-28 LAB — CMP14+EGFR
ALBUMIN: 4.7 g/dL (ref 3.5–5.5)
ALT: 12 IU/L (ref 0–32)
AST: 23 IU/L (ref 0–40)
Albumin/Globulin Ratio: 2.2 (ref 1.1–2.5)
Alkaline Phosphatase: 79 IU/L (ref 39–117)
BUN/Creatinine Ratio: 11 (ref 9–23)
BUN: 10 mg/dL (ref 6–24)
CALCIUM: 9.6 mg/dL (ref 8.7–10.2)
CHLORIDE: 98 mmol/L (ref 97–108)
CO2: 26 mmol/L (ref 18–29)
Creatinine, Ser: 0.92 mg/dL (ref 0.57–1.00)
GFR calc Af Amer: 81 mL/min/{1.73_m2} (ref >59)
GFR calc non Af Amer: 70 mL/min/{1.73_m2} (ref >59)
Globulin, Total: 2.1 g/dL (ref 1.5–4.5)
Glucose: 85 mg/dL (ref 65–99)
POTASSIUM: 4.1 mmol/L (ref 3.5–5.2)
Sodium: 140 mmol/L (ref 134–144)
TOTAL PROTEIN: 6.8 g/dL (ref 6.0–8.5)
Total Bilirubin: 0.4 mg/dL (ref 0.0–1.2)

## 2014-07-28 LAB — NMR, LIPOPROFILE
CHOLESTEROL: 222 mg/dL — AB (ref 100–199)
HDL Cholesterol by NMR: 67 mg/dL (ref >39)
HDL Particle Number: 43 umol/L (ref >=30.5)
LDL Particle Number: 1461 nmol/L — ABNORMAL HIGH (ref <1000)
LDL Size: 20.4 nm (ref >20.5)
LDLC SERPL CALC-MCNC: 136 mg/dL — ABNORMAL HIGH (ref 0–99)
LP-IR Score: 51 — ABNORMAL HIGH (ref <=45)
Small LDL Particle Number: 709 nmol/L — ABNORMAL HIGH (ref <=527)
TRIGLYCERIDES BY NMR: 96 mg/dL (ref 0–149)

## 2014-07-28 LAB — THYROID PANEL WITH TSH
Free Thyroxine Index: 2.8 (ref 1.2–4.9)
T3 UPTAKE RATIO: 27 % (ref 24–39)
T4, Total: 10.4 ug/dL (ref 4.5–12.0)
TSH: 0.683 u[IU]/mL (ref 0.450–4.500)

## 2014-09-15 ENCOUNTER — Other Ambulatory Visit: Payer: Self-pay

## 2015-01-29 ENCOUNTER — Encounter: Payer: Self-pay | Admitting: Nurse Practitioner

## 2015-01-29 ENCOUNTER — Ambulatory Visit (INDEPENDENT_AMBULATORY_CARE_PROVIDER_SITE_OTHER): Payer: Federal, State, Local not specified - PPO | Admitting: Nurse Practitioner

## 2015-01-29 VITALS — BP 104/64 | HR 67 | Temp 96.7°F | Ht 69.0 in | Wt 195.0 lb

## 2015-01-29 DIAGNOSIS — E785 Hyperlipidemia, unspecified: Secondary | ICD-10-CM

## 2015-01-29 DIAGNOSIS — E038 Other specified hypothyroidism: Secondary | ICD-10-CM | POA: Diagnosis not present

## 2015-01-29 DIAGNOSIS — E034 Atrophy of thyroid (acquired): Secondary | ICD-10-CM

## 2015-01-29 DIAGNOSIS — Z780 Asymptomatic menopausal state: Secondary | ICD-10-CM | POA: Diagnosis not present

## 2015-01-29 DIAGNOSIS — K219 Gastro-esophageal reflux disease without esophagitis: Secondary | ICD-10-CM | POA: Diagnosis not present

## 2015-01-29 MED ORDER — PITAVASTATIN CALCIUM 2 MG PO TABS: 2.0000 mg | ORAL_TABLET | Freq: Every day | ORAL | Status: DC

## 2015-01-29 MED ORDER — ESTROGENS, CONJUGATED 0.625 MG/GM VA CREA: 1.0000 | TOPICAL_CREAM | Freq: Every day | VAGINAL | Status: DC

## 2015-01-29 MED ORDER — OMEPRAZOLE 40 MG PO CPDR: 40.0000 mg | DELAYED_RELEASE_CAPSULE | Freq: Every day | ORAL | Status: DC

## 2015-01-29 NOTE — Patient Instructions (Signed)

## 2015-01-29 NOTE — Progress Notes (Signed)
  Subjective:    Patient ID: Melissa Gilbert, female    DOB: 1956/11/24, 59 y.o.   MRN: [id number]  Patient here today for follow up of chronic medical problems. Patient complaint of a nodule felt on her right lower leg which she noticed about a month ago. She denies any tenderness.    Hyperlipidemia This is a chronic problem. The current episode started more than 1 year ago. The problem is controlled. There are no known factors aggravating her hyperlipidemia. Current antihyperlipidemic treatment includes statins. The current treatment provides significant improvement of lipids. There are no compliance problems.  Risk factors for coronary artery disease include post-menopausal.  HYpothyroidism Levothyroxin 167mcg. No C/O fatigue [name] Has been taking omeprazole OTC which is working great Allergic rhinitis Allegra working well- no complaints  Review of Systems  Constitutional: Negative.   HENT: Negative.   Eyes: Negative.   Respiratory: Negative.   Cardiovascular: Negative.   Gastrointestinal: Negative.   Endocrine: Negative.   Genitourinary: Negative.   Musculoskeletal: Negative.   Skin: Negative.   Allergic/Immunologic: Negative.   Neurological: Negative.   Hematological: Negative.   Psychiatric/Behavioral: Negative.   All other systems reviewed and are negative.      Objective:   Physical Exam  Constitutional: She is oriented to person, place, and time. She appears well-developed and well-nourished.  HENT:  Nose: Nose normal.  Mouth/Throat: Oropharynx is clear and moist.  Eyes: EOM are normal.  Neck: Trachea normal, normal range of motion and full passive range of motion without pain. Neck supple. No JVD present. Carotid bruit is not present. No thyromegaly present.  Cardiovascular: Normal rate, regular rhythm, normal heart sounds and intact distal pulses.  Exam reveals no gallop and no friction rub.   No murmur heard. Pulmonary/Chest: Effort normal and breath sounds  normal.  Abdominal: Soft. Bowel sounds are normal. She exhibits no distension and no mass. There is no tenderness.  Musculoskeletal: Normal range of motion.  Lymphadenopathy:    She has no cervical adenopathy.  Neurological: She is alert and oriented to person, place, and time. She has normal reflexes.  Skin: Skin is warm and dry.  Psychiatric: She has a normal mood and affect. Her behavior is normal. Judgment and thought content normal.   BP 104/64 mmHg  Pulse 67  Temp(Src) 96.7 F (35.9 C) (Oral)  Ht $R'5\' 9"'UV$  (1.753 m)  Wt 195 lb (88.451 kg)  BMI 28.78 kg/m2          Assessment & Plan:   1. Gastroesophageal reflux disease without esophagitis Avoid spicy food  Avoid eating 2 hour before going to bed - omeprazole (PRILOSEC) 40 MG capsule; Take 1 capsule (40 mg total) by mouth daily.  Dispense: 30 capsule; Refill: 3  2. Hypothyroidism due to acquired atrophy of thyroid Continue prescribed medication  3. Hyperlipidemia Low fat diet  - CMP14+EGFR - NMR, lipoprofile - Pitavastatin Calcium (LIVALO) 2 MG TABS; Take 1 tablet (2 mg total) by mouth daily.  Dispense: 90 tablet; Refill: 1  4. Postmenopausal  - conjugated estrogens (PREMARIN) vaginal cream; Place 1 Applicatorful vaginally daily.  Dispense: 42.5 g; Refill: 12   hemoccult cards given to patient with directions Labs pending Health maintenance reviewed Diet and exercise encouraged Continue all meds Follow up  In 3 months    [place], FNP

## 2015-01-30 LAB — NMR, LIPOPROFILE
Cholesterol: 181 mg/dL (ref 100–199)
HDL Cholesterol by NMR: 55 mg/dL (ref >39)
HDL Particle Number: 40.5 umol/L (ref >=30.5)
LDL Particle Number: 1244 nmol/L — ABNORMAL HIGH (ref <1000)
LDL Size: 20.7 nm (ref >20.5)
LDL-C: 104 mg/dL — ABNORMAL HIGH (ref 0–99)
LP-IR SCORE: 49 — AB (ref <=45)
Small LDL Particle Number: 479 nmol/L (ref <=527)
Triglycerides by NMR: 109 mg/dL (ref 0–149)

## 2015-01-30 LAB — CMP14+EGFR
A/G RATIO: 2.4 (ref 1.1–2.5)
ALK PHOS: 88 IU/L (ref 39–117)
ALT: 14 IU/L (ref 0–32)
AST: 23 IU/L (ref 0–40)
Albumin: 4.7 g/dL (ref 3.5–5.5)
BILIRUBIN TOTAL: 0.2 mg/dL (ref 0.0–1.2)
BUN / CREAT RATIO: 14 (ref 9–23)
BUN: 12 mg/dL (ref 6–24)
CO2: 25 mmol/L (ref 18–29)
Calcium: 9.6 mg/dL (ref 8.7–10.2)
Chloride: 99 mmol/L (ref 97–108)
Creatinine, Ser: 0.85 mg/dL (ref 0.57–1.00)
GFR, EST AFRICAN AMERICAN: 89 mL/min/{1.73_m2} (ref >59)
GFR, EST NON AFRICAN AMERICAN: 77 mL/min/{1.73_m2} (ref >59)
Globulin, Total: 2 g/dL (ref 1.5–4.5)
Glucose: 91 mg/dL (ref 65–99)
POTASSIUM: 3.9 mmol/L (ref 3.5–5.2)
SODIUM: 143 mmol/L (ref 134–144)
Total Protein: 6.7 g/dL (ref 6.0–8.5)

## 2015-02-05 ENCOUNTER — Other Ambulatory Visit: Payer: Federal, State, Local not specified - PPO

## 2015-02-05 DIAGNOSIS — Z1212 Encounter for screening for malignant neoplasm of rectum: Secondary | ICD-10-CM

## 2015-02-07 LAB — FECAL OCCULT BLOOD, IMMUNOCHEMICAL: Fecal Occult Bld: NEGATIVE

## 2015-06-06 ENCOUNTER — Telehealth: Payer: Self-pay | Admitting: Nurse Practitioner

## 2015-06-06 NOTE — Telephone Encounter (Signed)
Appt given per patient request 

## 2015-06-08 ENCOUNTER — Encounter: Payer: Self-pay | Admitting: Nurse Practitioner

## 2015-06-08 ENCOUNTER — Ambulatory Visit (INDEPENDENT_AMBULATORY_CARE_PROVIDER_SITE_OTHER): Payer: Federal, State, Local not specified - PPO | Admitting: Nurse Practitioner

## 2015-06-08 VITALS — BP 111/65 | HR 75 | Temp 97.1°F | Ht 69.0 in | Wt 179.0 lb

## 2015-06-08 DIAGNOSIS — E785 Hyperlipidemia, unspecified: Secondary | ICD-10-CM

## 2015-06-08 DIAGNOSIS — E034 Atrophy of thyroid (acquired): Secondary | ICD-10-CM | POA: Diagnosis not present

## 2015-06-08 DIAGNOSIS — K219 Gastro-esophageal reflux disease without esophagitis: Secondary | ICD-10-CM | POA: Diagnosis not present

## 2015-06-08 DIAGNOSIS — E038 Other specified hypothyroidism: Secondary | ICD-10-CM

## 2015-06-08 MED ORDER — LEVOTHYROXINE SODIUM 125 MCG PO TABS: ORAL_TABLET | ORAL | Status: DC

## 2015-06-08 MED ORDER — PITAVASTATIN CALCIUM 2 MG PO TABS: 2.0000 mg | ORAL_TABLET | Freq: Every day | ORAL | Status: DC

## 2015-06-08 NOTE — Patient Instructions (Signed)
Exercise to Stay Healthy Exercise helps you become and stay healthy. EXERCISE IDEAS AND TIPS Choose exercises that:  You enjoy.  Fit into your day. You do not need to exercise really hard to be healthy. You can do exercises at a slow or medium level and stay healthy. You can:  Stretch before and after working out.  Try yoga, Pilates, or tai chi.  Lift weights.  Walk fast, swim, jog, run, climb stairs, bicycle, dance, or rollerskate.  Take aerobic classes. Exercises that burn about 150 calories:  Running 1  miles in 15 minutes.  Playing volleyball for 45 to 60 minutes.  Washing and waxing a car for 45 to 60 minutes.  Playing touch football for 45 minutes.  Walking 1  miles in 35 minutes.  Pushing a stroller 1  miles in 30 minutes.  Playing basketball for 30 minutes.  Raking leaves for 30 minutes.  Bicycling 5 miles in 30 minutes.  Walking 2 miles in 30 minutes.  Dancing for 30 minutes.  Shoveling snow for 15 minutes.  Swimming laps for 20 minutes.  Walking up stairs for 15 minutes.  Bicycling 4 miles in 15 minutes.  Gardening for 30 to 45 minutes.  Jumping rope for 15 minutes.  Washing windows or floors for 45 to 60 minutes. Document Released: 12/20/2010 Document Revised: 02/09/2012 Document Reviewed: 12/20/2010 [employer] Patient Information 2015 [employer]. This information is not intended to replace advice given to you by your health care provider. Make sure you discuss any questions you have with your health care provider.  

## 2015-06-08 NOTE — Progress Notes (Signed)
  Subjective:    Patient ID: Melissa Gilbert, female    DOB: [birth year], 57 y.o.   MRN: [id number]  Patient here today for follow up of chronic medical problems.   Hyperlipidemia This is a chronic problem. The current episode started more than 1 year ago. The problem is controlled. There are no known factors aggravating her hyperlipidemia. Current antihyperlipidemic treatment includes statins. The current treatment provides significant improvement of lipids. There are no compliance problems.  Risk factors for coronary artery disease include post-menopausal.  HYpothyroidism Levothyroxin 133mcg. No C/O fatigue Melissa Gilbert Has been taking omeprazole OTC which is working great Allergic rhinitis Allegra working well- no complaints  Review of Systems  Constitutional: Negative.   HENT: Negative.   Eyes: Negative.   Respiratory: Negative.   Cardiovascular: Negative.   Gastrointestinal: Negative.   Endocrine: Negative.   Genitourinary: Negative.   Musculoskeletal: Negative.   Skin: Negative.   Allergic/Immunologic: Negative.   Neurological: Negative.   Hematological: Negative.   Psychiatric/Behavioral: Negative.   All other systems reviewed and are negative.      Objective:   Physical Exam  Constitutional: She is oriented to person, place, and time. She appears well-developed and well-nourished.  HENT:  Nose: Nose normal.  Mouth/Throat: Oropharynx is clear and moist.  Eyes: EOM are normal.  Neck: Trachea normal, normal range of motion and full passive range of motion without pain. Neck supple. No JVD present. Carotid bruit is not present. No thyromegaly present.  Cardiovascular: Normal rate, regular rhythm, normal heart sounds and intact distal pulses.  Exam reveals no gallop and no friction rub.   No murmur heard. Pulmonary/Chest: Effort normal and breath sounds normal.  Abdominal: Soft. Bowel sounds are normal. She exhibits no distension and no mass. There is no tenderness.   Musculoskeletal: Normal range of motion.  Lymphadenopathy:    She has no cervical adenopathy.  Neurological: She is alert and oriented to person, place, and time. She has normal reflexes.  Skin: Skin is warm and dry.  Psychiatric: She has a normal mood and affect. Her behavior is normal. Judgment and thought content normal.   BP 111/65 mmHg  Pulse 75  Temp(Src) 97.1 F (36.2 C) (Oral)  Ht $R'5\' 9"'np$  (1.753 m)  Wt 179 lb (81.194 kg)  BMI 26.42 kg/m2       Assessment & Plan:   1. Gastroesophageal reflux disease without esophagitis Avoid spicy foods Do not eat 2 hours prior to bedtime   2. Hypothyroidism due to acquired atrophy of thyroid - levothyroxine (SYNTHROID, LEVOTHROID) 125 MCG tablet; TAKE 1 TABLET DAILY BEFORE BREAKFAST  Dispense: 30 tablet; Refill: 9 - Thyroid Panel With TSH  3. Hyperlipidemia Low fat diet - Pitavastatin Calcium (LIVALO) 2 MG TABS; Take 1 tablet (2 mg total) by mouth daily.  Dispense: 90 tablet; Refill: 1 - CMP14+EGFR - Lipid panel    Labs pending Health maintenance reviewed Diet and exercise encouraged Continue all meds Follow up  In 3 months    [place], FNP

## 2015-06-09 LAB — CMP14+EGFR
ALBUMIN: 4.6 g/dL (ref 3.5–5.5)
ALT: 38 IU/L — ABNORMAL HIGH (ref 0–32)
AST: 38 IU/L (ref 0–40)
Albumin/Globulin Ratio: 2.3 (ref 1.1–2.5)
Alkaline Phosphatase: 87 IU/L (ref 39–117)
BUN / CREAT RATIO: 18 (ref 9–23)
BUN: 15 mg/dL (ref 6–24)
Bilirubin Total: 0.2 mg/dL (ref 0.0–1.2)
CHLORIDE: 99 mmol/L (ref 97–108)
CO2: 28 mmol/L (ref 18–29)
CREATININE: 0.82 mg/dL (ref 0.57–1.00)
Calcium: 9.4 mg/dL (ref 8.7–10.2)
GFR calc Af Amer: 93 mL/min/{1.73_m2} (ref >59)
GFR calc non Af Amer: 80 mL/min/{1.73_m2} (ref >59)
GLOBULIN, TOTAL: 2 g/dL (ref 1.5–4.5)
GLUCOSE: 92 mg/dL (ref 65–99)
Potassium: 4.3 mmol/L (ref 3.5–5.2)
Sodium: 140 mmol/L (ref 134–144)
TOTAL PROTEIN: 6.6 g/dL (ref 6.0–8.5)

## 2015-06-09 LAB — THYROID PANEL WITH TSH
Free Thyroxine Index: 2.1 (ref 1.2–4.9)
T3 Uptake Ratio: 24 % (ref 24–39)
T4, Total: 8.8 ug/dL (ref 4.5–12.0)
TSH: 3.13 u[IU]/mL (ref 0.450–4.500)

## 2015-06-09 LAB — LIPID PANEL
Chol/HDL Ratio: 3 ratio units (ref 0.0–4.4)
Cholesterol, Total: 198 mg/dL (ref 100–199)
HDL: 67 mg/dL (ref >39)
LDL Calculated: 105 mg/dL — ABNORMAL HIGH (ref 0–99)
Triglycerides: 128 mg/dL (ref 0–149)
VLDL Cholesterol Cal: 26 mg/dL (ref 5–40)

## 2016-02-26 ENCOUNTER — Encounter: Payer: Self-pay | Admitting: Nurse Practitioner

## 2016-02-26 ENCOUNTER — Ambulatory Visit (INDEPENDENT_AMBULATORY_CARE_PROVIDER_SITE_OTHER): Payer: Federal, State, Local not specified - PPO | Admitting: Nurse Practitioner

## 2016-02-26 VITALS — BP 109/68 | HR 70 | Temp 97.7°F | Ht 69.0 in | Wt 188.0 lb

## 2016-02-26 DIAGNOSIS — E785 Hyperlipidemia, unspecified: Secondary | ICD-10-CM | POA: Diagnosis not present

## 2016-02-26 DIAGNOSIS — E034 Atrophy of thyroid (acquired): Secondary | ICD-10-CM | POA: Diagnosis not present

## 2016-02-26 DIAGNOSIS — E038 Other specified hypothyroidism: Secondary | ICD-10-CM | POA: Diagnosis not present

## 2016-02-26 DIAGNOSIS — K219 Gastro-esophageal reflux disease without esophagitis: Secondary | ICD-10-CM

## 2016-02-26 DIAGNOSIS — J301 Allergic rhinitis due to pollen: Secondary | ICD-10-CM

## 2016-02-26 DIAGNOSIS — Z1212 Encounter for screening for malignant neoplasm of rectum: Secondary | ICD-10-CM

## 2016-02-26 DIAGNOSIS — Z1159 Encounter for screening for other viral diseases: Secondary | ICD-10-CM | POA: Diagnosis not present

## 2016-02-26 DIAGNOSIS — Z6827 Body mass index (BMI) 27.0-27.9, adult: Secondary | ICD-10-CM

## 2016-02-26 MED ORDER — PITAVASTATIN CALCIUM 2 MG PO TABS: 2.0000 mg | ORAL_TABLET | Freq: Every day | ORAL | Status: DC

## 2016-02-26 MED ORDER — FEXOFENADINE HCL 180 MG PO TABS: 180.0000 mg | ORAL_TABLET | Freq: Every day | ORAL | Status: DC

## 2016-02-26 MED ORDER — LEVOTHYROXINE SODIUM 125 MCG PO TABS: ORAL_TABLET | ORAL | Status: DC

## 2016-02-26 NOTE — Patient Instructions (Signed)
Health Maintenance, Female Adopting a healthy lifestyle and getting preventive care can go a long way to promote health and wellness. Talk with your health care provider about what schedule of regular examinations is right for you. This is a good chance for you to check in with your provider about disease prevention and staying healthy. In between checkups, there are plenty of things you can do on your own. Experts have done a lot of research about which lifestyle changes and preventive measures are most likely to keep you healthy. Ask your health care provider for more information. WEIGHT AND DIET  Eat a healthy diet  Be sure to include plenty of vegetables, fruits, low-fat dairy products, and lean protein.  Do not eat a lot of foods high in solid fats, added sugars, or salt.  Get regular exercise. This is one of the most important things you can do for your health.  Most adults should exercise for at least 150 minutes each week. The exercise should increase your heart rate and make you sweat (moderate-intensity exercise).  Most adults should also do strengthening exercises at least twice a week. This is in addition to the moderate-intensity exercise.  Maintain a healthy weight  Body mass index (BMI) is a measurement that can be used to identify possible weight problems. It estimates body fat based on height and weight. Your health care provider can help determine your BMI and help you achieve or maintain a healthy weight.  For females 20 years of age and older:   A BMI below 18.5 is considered underweight.  A BMI of 18.5 to 24.9 is normal.  A BMI of 25 to 29.9 is considered overweight.  A BMI of 30 and above is considered obese.  Watch levels of cholesterol and blood lipids  You should start having your blood tested for lipids and cholesterol at 58 years of age, then have this test every 5 years.  You may need to have your cholesterol levels checked more often if:  Your lipid  or cholesterol levels are high.  You are older than 58 years of age.  You are at high risk for heart disease.  CANCER SCREENING   Lung Cancer  Lung cancer screening is recommended for adults 55-80 years old who are at high risk for lung cancer because of a history of smoking.  A yearly low-dose CT scan of the lungs is recommended for people who:  Currently smoke.  Have quit within the past 15 years.  Have at least a 30-pack-year history of smoking. A pack year is smoking an average of one pack of cigarettes a day for 1 year.  Yearly screening should continue until it has been 15 years since you quit.  Yearly screening should stop if you develop a health problem that would prevent you from having lung cancer treatment.  Breast Cancer  Practice breast self-awareness. This means understanding how your breasts normally appear and feel.  It also means doing regular breast self-exams. Let your health care provider know about any changes, no matter how small.  If you are in your 20s or 30s, you should have a clinical breast exam (CBE) by a health care provider every 1-3 years as part of a regular health exam.  If you are 40 or older, have a CBE every year. Also consider having a breast X-ray (mammogram) every year.  If you have a family history of breast cancer, talk to your health care provider about genetic screening.  If you   are at high risk for breast cancer, talk to your health care provider about having an MRI and a mammogram every year.  Breast cancer gene (BRCA) assessment is recommended for women who have family members with BRCA-related cancers. BRCA-related cancers include:  Breast.  Ovarian.  Tubal.  Peritoneal cancers.  Results of the assessment will determine the need for genetic counseling and BRCA1 and BRCA2 testing. Cervical Cancer Your health care provider may recommend that you be screened regularly for cancer of the pelvic organs (ovaries, uterus, and  vagina). This screening involves a pelvic examination, including checking for microscopic changes to the surface of your cervix (Pap test). You may be encouraged to have this screening done every 3 years, beginning at age 21.  For women ages 30-65, health care providers may recommend pelvic exams and Pap testing every 3 years, or they may recommend the Pap and pelvic exam, combined with testing for human papilloma virus (HPV), every 5 years. Some types of HPV increase your risk of cervical cancer. Testing for HPV may also be done on women of any age with unclear Pap test results.  Other health care providers may not recommend any screening for nonpregnant women who are considered low risk for pelvic cancer and who do not have symptoms. Ask your health care provider if a screening pelvic exam is right for you.  If you have had past treatment for cervical cancer or a condition that could lead to cancer, you need Pap tests and screening for cancer for at least 20 years after your treatment. If Pap tests have been discontinued, your risk factors (such as having a new sexual partner) need to be reassessed to determine if screening should resume. Some women have medical problems that increase the chance of getting cervical cancer. In these cases, your health care provider may recommend more frequent screening and Pap tests. Colorectal Cancer  This type of cancer can be detected and often prevented.  Routine colorectal cancer screening usually begins at 58 years of age and continues through 58 years of age.  Your health care provider may recommend screening at an earlier age if you have risk factors for colon cancer.  Your health care provider may also recommend using home test kits to check for hidden blood in the stool.  A small camera at the end of a tube can be used to examine your colon directly (sigmoidoscopy or colonoscopy). This is done to check for the earliest forms of colorectal  cancer.  Routine screening usually begins at age 50.  Direct examination of the colon should be repeated every 5-10 years through 58 years of age. However, you may need to be screened more often if early forms of precancerous polyps or small growths are found. Skin Cancer  Check your skin from head to toe regularly.  Tell your health care provider about any new moles or changes in moles, especially if there is a change in a mole's shape or color.  Also tell your health care provider if you have a mole that is larger than the size of a pencil eraser.  Always use sunscreen. Apply sunscreen liberally and repeatedly throughout the day.  Protect yourself by wearing long sleeves, pants, a wide-brimmed hat, and sunglasses whenever you are outside. HEART DISEASE, DIABETES, AND HIGH BLOOD PRESSURE   High blood pressure causes heart disease and increases the risk of stroke. High blood pressure is more likely to develop in:  People who have blood pressure in the high end   of the normal range (130-139/85-89 mm Hg).  People who are overweight or obese.  People who are African American.  If you are 38-23 years of age, have your blood pressure checked every 3-5 years. If you are 61 years of age or older, have your blood pressure checked every year. You should have your blood pressure measured twice--once when you are at a hospital or clinic, and once when you are not at a hospital or clinic. Record the average of the two measurements. To check your blood pressure when you are not at a hospital or clinic, you can use:  An automated blood pressure machine at a pharmacy.  A home blood pressure monitor.  If you are between 45 years and 39 years old, ask your health care provider if you should take aspirin to prevent strokes.  Have regular diabetes screenings. This involves taking a blood sample to check your fasting blood sugar level.  If you are at a normal weight and have a low risk for diabetes,  have this test once every three years after 58 years of age.  If you are overweight and have a high risk for diabetes, consider being tested at a younger age or more often. PREVENTING INFECTION  Hepatitis B  If you have a higher risk for hepatitis B, you should be screened for this virus. You are considered at high risk for hepatitis B if:  You were born in a country where hepatitis B is common. Ask your health care provider which countries are considered high risk.  Your parents were born in a high-risk country, and you have not been immunized against hepatitis B (hepatitis B vaccine).  You have HIV or AIDS.  You use needles to inject street drugs.  You live with someone who has hepatitis B.  You have had sex with someone who has hepatitis B.  You get hemodialysis treatment.  You take certain medicines for conditions, including cancer, organ transplantation, and autoimmune conditions. Hepatitis C  Blood testing is recommended for:  Everyone born from [birth year] through 1963.  Anyone with known risk factors for hepatitis C. Sexually transmitted infections (STIs)  You should be screened for sexually transmitted infections (STIs) including gonorrhea and chlamydia if:  You are sexually active and are younger than 58 years of age.  You are older than 58 years of age and your health care provider tells you that you are at risk for this type of infection.  Your sexual activity has changed since you were last screened and you are at an increased risk for chlamydia or gonorrhea. Ask your health care provider if you are at risk.  If you do not have HIV, but are at risk, it may be recommended that you take a prescription medicine daily to prevent HIV infection. This is called pre-exposure prophylaxis (PrEP). You are considered at risk if:  You are sexually active and do not regularly use condoms or know the HIV status of your partner(s).  You take drugs by injection.  You are sexually  active with a partner who has HIV. Talk with your health care provider about whether you are at high risk of being infected with HIV. If you choose to begin PrEP, you should first be tested for HIV. You should then be tested every 3 months for as long as you are taking PrEP.  PREGNANCY   If you are premenopausal and you may become pregnant, ask your health care provider about preconception counseling.  If you may  become pregnant, take 400 to 800 micrograms (mcg) of folic acid every day.  If you want to prevent pregnancy, talk to your health care provider about birth control (contraception). OSTEOPOROSIS AND MENOPAUSE   Osteoporosis is a disease in which the bones lose minerals and strength with aging. This can result in serious bone fractures. Your risk for osteoporosis can be identified using a bone density scan.  If you are 61 years of age or older, or if you are at risk for osteoporosis and fractures, ask your health care provider if you should be screened.  Ask your health care provider whether you should take a calcium or vitamin D supplement to lower your risk for osteoporosis.  Menopause may have certain physical symptoms and risks.  Hormone replacement therapy may reduce some of these symptoms and risks. Talk to your health care provider about whether hormone replacement therapy is right for you.  HOME CARE INSTRUCTIONS   Schedule regular health, dental, and eye exams.  Stay current with your immunizations.   Do not use any tobacco products including cigarettes, chewing tobacco, or electronic cigarettes.  If you are pregnant, do not drink alcohol.  If you are breastfeeding, limit how much and how often you drink alcohol.  Limit alcohol intake to no more than 1 drink per day for nonpregnant women. One drink equals 12 ounces of beer, 5 ounces of wine, or 1 ounces of hard liquor.  Do not use street drugs.  Do not share needles.  Ask your health care provider for help if  you need support or information about quitting drugs.  Tell your health care provider if you often feel depressed.  Tell your health care provider if you have ever been abused or do not feel safe at home.   This information is not intended to replace advice given to you by your health care provider. Make sure you discuss any questions you have with your health care provider.   Document Released: 06/02/2011 Document Revised: 12/08/2014 Document Reviewed: 10/19/2013 Elsevier Interactive Patient Education [employer].

## 2016-02-26 NOTE — Progress Notes (Signed)
Subjective:    Patient ID: Melissa Gilbert, female    DOB: 09/01/1956, 60 y.o.   MRN: [id number]  Patient here today for follow up of chronic medical problems.  Outpatient Encounter Prescriptions as of 02/26/2016  Medication Sig  . Black Cohosh 40 MG CAPS Take 40 mg by mouth 3 (three) times daily.  . Cholecalciferol (VITAMIN D3) 2000 UNITS capsule Take 2,000 Units by mouth daily.  Marland Kitchen conjugated estrogens (PREMARIN) vaginal cream Place 1 Applicatorful vaginally daily.  . fexofenadine (ALLEGRA) 180 MG tablet Take 1 tablet (180 mg total) by mouth daily.  Marland Kitchen levothyroxine (SYNTHROID, LEVOTHROID) 125 MCG tablet TAKE 1 TABLET DAILY BEFORE BREAKFAST  . Pitavastatin Calcium (LIVALO) 2 MG TABS Take 1 tablet (2 mg total) by mouth daily.  . [DISCONTINUED] Multiple Vitamins-Minerals (CENTRUM SILVER ULTRA WOMENS PO) Take 1 tablet by mouth daily.   No facility-administered encounter medications on file as of 02/26/2016.     Hyperlipidemia This is a chronic problem. The current episode started more than 1 year ago. The problem is controlled. There are no known factors aggravating her hyperlipidemia. Current antihyperlipidemic treatment includes statins. The current treatment provides significant improvement of lipids. There are no compliance problems.  Risk factors for coronary artery disease include post-menopausal.  HYpothyroidism Levothyroxin 114mg. No C/O fatigue GJerrye BushyHas been taking omeprazole OTC which is working great Allergic rhinitis Allegra working well- no complaints  Review of Systems  Constitutional: Negative.   HENT: Negative.   Eyes: Negative.   Respiratory: Negative.   Cardiovascular: Negative.   Gastrointestinal: Negative.   Endocrine: Negative.   Genitourinary: Negative.   Musculoskeletal: Negative.   Skin: Negative.   Allergic/Immunologic: Negative.   Neurological: Negative.   Hematological: Negative.   Psychiatric/Behavioral: Negative.   All other systems reviewed and  are negative.      Objective:   Physical Exam  Constitutional: She is oriented to person, place, and time. She appears well-developed and well-nourished.  HENT:  Nose: Nose normal.  Mouth/Throat: Oropharynx is clear and moist.  Eyes: EOM are normal.  Neck: Trachea normal, normal range of motion and full passive range of motion without pain. Neck supple. No JVD present. Carotid bruit is not present. No thyromegaly present.  Cardiovascular: Normal rate, regular rhythm, normal heart sounds and intact distal pulses.  Exam reveals no gallop and no friction rub.   No murmur heard. Pulmonary/Chest: Effort normal and breath sounds normal.  Abdominal: Soft. Bowel sounds are normal. She exhibits no distension and no mass. There is no tenderness.  Musculoskeletal: Normal range of motion.  Lymphadenopathy:    She has no cervical adenopathy.  Neurological: She is alert and oriented to person, place, and time. She has normal reflexes.  Skin: Skin is warm and dry.  Psychiatric: She has a normal mood and affect. Her behavior is normal. Judgment and thought content normal.   BP 109/68 mmHg  Pulse 70  Temp(Src) 97.7 F (36.5 C) (Oral)  Ht _0  (1.753 m)  Wt 188 lb (85.276 kg)  BMI 27.75 kg/m2        Assessment & Plan:   1. Allergic rhinitis due to pollen Continue allegra as rx  2. Gastroesophageal reflux disease without esophagitis Avoid spicy foods Do not eat 2 hours prior to bedtime  3. Hypothyroidism due to acquired atrophy of thyroid - levothyroxine (SYNTHROID, LEVOTHROID) 125 MCG tablet; TAKE 1 TABLET DAILY BEFORE BREAKFAST  Dispense: 30 tablet; Refill: 9 - Thyroid Panel With TSH  4. Hyperlipidemia Low fat diet -  CMP14+EGFR - Lipid panel - Pitavastatin Calcium (LIVALO) 2 MG TABS; Take 1 tablet (2 mg total) by mouth daily.  Dispense: 90 tablet; Refill: 1  5. Screening for malignant neoplasm of the rectum - Fecal occult blood, imunochemical; Future  6. Need for hepatitis C  screening test - Hepatitis C antibody  7. Non-seasonal allergic rhinitis due to pollen - fexofenadine (ALLEGRA) 180 MG tablet; Take 1 tablet (180 mg total) by mouth daily.  Dispense: 30 tablet; Refill: 5  8. BMI 27.0-27.9,adult Discussed diet and exercise for person with BMI >25 Will recheck weight in 3-6 months     Labs pending Health maintenance reviewed Diet and exercise encouraged Continue all meds Follow up  In 6 months   [place], FNP

## 2016-02-27 LAB — CMP14+EGFR
A/G RATIO: 2.1 (ref 1.2–2.2)
ALBUMIN: 4.6 g/dL (ref 3.5–5.5)
ALT: 19 IU/L (ref 0–32)
AST: 26 IU/L (ref 0–40)
Alkaline Phosphatase: 71 IU/L (ref 39–117)
BILIRUBIN TOTAL: 0.2 mg/dL (ref 0.0–1.2)
BUN / CREAT RATIO: 20 (ref 9–23)
BUN: 16 mg/dL (ref 6–24)
CALCIUM: 9.7 mg/dL (ref 8.7–10.2)
CHLORIDE: 100 mmol/L (ref 96–106)
CO2: 24 mmol/L (ref 18–29)
Creatinine, Ser: 0.81 mg/dL (ref 0.57–1.00)
GFR, EST AFRICAN AMERICAN: 93 mL/min/{1.73_m2} (ref >59)
GFR, EST NON AFRICAN AMERICAN: 81 mL/min/{1.73_m2} (ref >59)
GLOBULIN, TOTAL: 2.2 g/dL (ref 1.5–4.5)
GLUCOSE: 87 mg/dL (ref 65–99)
POTASSIUM: 4.3 mmol/L (ref 3.5–5.2)
SODIUM: 142 mmol/L (ref 134–144)
TOTAL PROTEIN: 6.8 g/dL (ref 6.0–8.5)

## 2016-02-27 LAB — THYROID PANEL WITH TSH
FREE THYROXINE INDEX: 2.3 (ref 1.2–4.9)
T3 UPTAKE RATIO: 24 % (ref 24–39)
T4, Total: 9.7 ug/dL (ref 4.5–12.0)
TSH: 2.38 u[IU]/mL (ref 0.450–4.500)

## 2016-02-27 LAB — LIPID PANEL
CHOL/HDL RATIO: 2.8 ratio (ref 0.0–4.4)
Cholesterol, Total: 207 mg/dL — ABNORMAL HIGH (ref 100–199)
HDL: 73 mg/dL (ref >39)
LDL Calculated: 108 mg/dL — ABNORMAL HIGH (ref 0–99)
Triglycerides: 128 mg/dL (ref 0–149)
VLDL Cholesterol Cal: 26 mg/dL (ref 5–40)

## 2016-02-27 LAB — HEPATITIS C ANTIBODY

## 2016-02-28 ENCOUNTER — Other Ambulatory Visit: Payer: Federal, State, Local not specified - PPO

## 2016-02-28 DIAGNOSIS — Z1212 Encounter for screening for malignant neoplasm of rectum: Secondary | ICD-10-CM

## 2016-03-02 LAB — FECAL OCCULT BLOOD, IMMUNOCHEMICAL: FECAL OCCULT BLD: NEGATIVE

## 2016-03-27 DIAGNOSIS — Z6826 Body mass index (BMI) 26.0-26.9, adult: Secondary | ICD-10-CM | POA: Diagnosis not present

## 2016-03-27 DIAGNOSIS — Z01419 Encounter for gynecological examination (general) (routine) without abnormal findings: Secondary | ICD-10-CM | POA: Diagnosis not present

## 2016-04-08 DIAGNOSIS — S61210A Laceration without foreign body of right index finger without damage to nail, initial encounter: Secondary | ICD-10-CM | POA: Diagnosis not present

## 2016-09-25 DIAGNOSIS — D485 Neoplasm of uncertain behavior of skin: Secondary | ICD-10-CM | POA: Diagnosis not present

## 2016-09-25 DIAGNOSIS — L821 Other seborrheic keratosis: Secondary | ICD-10-CM | POA: Diagnosis not present

## 2016-09-25 DIAGNOSIS — L814 Other melanin hyperpigmentation: Secondary | ICD-10-CM | POA: Diagnosis not present

## 2016-09-25 DIAGNOSIS — L309 Dermatitis, unspecified: Secondary | ICD-10-CM | POA: Diagnosis not present

## 2016-09-25 DIAGNOSIS — D225 Melanocytic nevi of trunk: Secondary | ICD-10-CM | POA: Diagnosis not present

## 2016-10-02 ENCOUNTER — Encounter: Payer: Self-pay | Admitting: Nurse Practitioner

## 2016-10-02 ENCOUNTER — Ambulatory Visit (INDEPENDENT_AMBULATORY_CARE_PROVIDER_SITE_OTHER): Payer: Federal, State, Local not specified - PPO | Admitting: Nurse Practitioner

## 2016-10-02 VITALS — BP 96/58 | HR 64 | Temp 97.9°F | Ht 69.0 in | Wt 191.0 lb

## 2016-10-02 DIAGNOSIS — J301 Allergic rhinitis due to pollen: Secondary | ICD-10-CM

## 2016-10-02 DIAGNOSIS — E034 Atrophy of thyroid (acquired): Secondary | ICD-10-CM

## 2016-10-02 DIAGNOSIS — K219 Gastro-esophageal reflux disease without esophagitis: Secondary | ICD-10-CM

## 2016-10-02 DIAGNOSIS — E782 Mixed hyperlipidemia: Secondary | ICD-10-CM

## 2016-10-02 DIAGNOSIS — Z6827 Body mass index (BMI) 27.0-27.9, adult: Secondary | ICD-10-CM | POA: Diagnosis not present

## 2016-10-02 MED ORDER — PITAVASTATIN CALCIUM 2 MG PO TABS: 2.0000 mg | ORAL_TABLET | Freq: Every day | ORAL | 1 refills | Status: DC

## 2016-10-02 MED ORDER — LEVOTHYROXINE SODIUM 125 MCG PO TABS: ORAL_TABLET | ORAL | 9 refills | Status: DC

## 2016-10-02 NOTE — Patient Instructions (Signed)
Health Maintenance, Female Adopting a healthy lifestyle and getting preventive care can go a long way to promote health and wellness. Talk with your health care provider about what schedule of regular examinations is right for you. This is a good chance for you to check in with your provider about disease prevention and staying healthy. In between checkups, there are plenty of things you can do on your own. Experts have done a lot of research about which lifestyle changes and preventive measures are most likely to keep you healthy. Ask your health care provider for more information. WEIGHT AND DIET  Eat a healthy diet  Be sure to include plenty of vegetables, fruits, low-fat dairy products, and lean protein.  Do not eat a lot of foods high in solid fats, added sugars, or salt.  Get regular exercise. This is one of the most important things you can do for your health.  Most adults should exercise for at least 150 minutes each week. The exercise should increase your heart rate and make you sweat (moderate-intensity exercise).  Most adults should also do strengthening exercises at least twice a week. This is in addition to the moderate-intensity exercise.  Maintain a healthy weight  Body mass index (BMI) is a measurement that can be used to identify possible weight problems. It estimates body fat based on height and weight. Your health care provider can help determine your BMI and help you achieve or maintain a healthy weight.  For females 20 years of age and older:   A BMI below 18.5 is considered underweight.  A BMI of 18.5 to 24.9 is normal.  A BMI of 25 to 29.9 is considered overweight.  A BMI of 30 and above is considered obese.  Watch levels of cholesterol and blood lipids  You should start having your blood tested for lipids and cholesterol at 58 years of age, then have this test every 5 years.  You may need to have your cholesterol levels checked more often if:  Your lipid  or cholesterol levels are high.  You are older than 58 years of age.  You are at high risk for heart disease.  CANCER SCREENING   Lung Cancer  Lung cancer screening is recommended for adults 55-80 years old who are at high risk for lung cancer because of a history of smoking.  A yearly low-dose CT scan of the lungs is recommended for people who:  Currently smoke.  Have quit within the past 15 years.  Have at least a 30-pack-year history of smoking. A pack year is smoking an average of one pack of cigarettes a day for 1 year.  Yearly screening should continue until it has been 15 years since you quit.  Yearly screening should stop if you develop a health problem that would prevent you from having lung cancer treatment.  Breast Cancer  Practice breast self-awareness. This means understanding how your breasts normally appear and feel.  It also means doing regular breast self-exams. Let your health care provider know about any changes, no matter how small.  If you are in your 20s or 30s, you should have a clinical breast exam (CBE) by a health care provider every 1-3 years as part of a regular health exam.  If you are 40 or older, have a CBE every year. Also consider having a breast X-ray (mammogram) every year.  If you have a family history of breast cancer, talk to your health care provider about genetic screening.  If you   are at high risk for breast cancer, talk to your health care provider about having an MRI and a mammogram every year.  Breast cancer gene (BRCA) assessment is recommended for women who have family members with BRCA-related cancers. BRCA-related cancers include:  Breast.  Ovarian.  Tubal.  Peritoneal cancers.  Results of the assessment will determine the need for genetic counseling and BRCA1 and BRCA2 testing. Cervical Cancer Your health care provider may recommend that you be screened regularly for cancer of the pelvic organs (ovaries, uterus, and  vagina). This screening involves a pelvic examination, including checking for microscopic changes to the surface of your cervix (Pap test). You may be encouraged to have this screening done every 3 years, beginning at age 21.  For women ages 30-65, health care providers may recommend pelvic exams and Pap testing every 3 years, or they may recommend the Pap and pelvic exam, combined with testing for human papilloma virus (HPV), every 5 years. Some types of HPV increase your risk of cervical cancer. Testing for HPV may also be done on women of any age with unclear Pap test results.  Other health care providers may not recommend any screening for nonpregnant women who are considered low risk for pelvic cancer and who do not have symptoms. Ask your health care provider if a screening pelvic exam is right for you.  If you have had past treatment for cervical cancer or a condition that could lead to cancer, you need Pap tests and screening for cancer for at least 20 years after your treatment. If Pap tests have been discontinued, your risk factors (such as having a new sexual partner) need to be reassessed to determine if screening should resume. Some women have medical problems that increase the chance of getting cervical cancer. In these cases, your health care provider may recommend more frequent screening and Pap tests. Colorectal Cancer  This type of cancer can be detected and often prevented.  Routine colorectal cancer screening usually begins at 58 years of age and continues through 58 years of age.  Your health care provider may recommend screening at an earlier age if you have risk factors for colon cancer.  Your health care provider may also recommend using home test kits to check for hidden blood in the stool.  A small camera at the end of a tube can be used to examine your colon directly (sigmoidoscopy or colonoscopy). This is done to check for the earliest forms of colorectal  cancer.  Routine screening usually begins at age 50.  Direct examination of the colon should be repeated every 5-10 years through 58 years of age. However, you may need to be screened more often if early forms of precancerous polyps or small growths are found. Skin Cancer  Check your skin from head to toe regularly.  Tell your health care provider about any new moles or changes in moles, especially if there is a change in a mole's shape or color.  Also tell your health care provider if you have a mole that is larger than the size of a pencil eraser.  Always use sunscreen. Apply sunscreen liberally and repeatedly throughout the day.  Protect yourself by wearing long sleeves, pants, a wide-brimmed hat, and sunglasses whenever you are outside. HEART DISEASE, DIABETES, AND HIGH BLOOD PRESSURE   High blood pressure causes heart disease and increases the risk of stroke. High blood pressure is more likely to develop in:  People who have blood pressure in the high end   of the normal range (130-139/85-89 mm Hg).  People who are overweight or obese.  People who are African American.  If you are 38-23 years of age, have your blood pressure checked every 3-5 years. If you are 61 years of age or older, have your blood pressure checked every year. You should have your blood pressure measured twice--once when you are at a hospital or clinic, and once when you are not at a hospital or clinic. Record the average of the two measurements. To check your blood pressure when you are not at a hospital or clinic, you can use:  An automated blood pressure machine at a pharmacy.  A home blood pressure monitor.  If you are between 45 years and 39 years old, ask your health care provider if you should take aspirin to prevent strokes.  Have regular diabetes screenings. This involves taking a blood sample to check your fasting blood sugar level.  If you are at a normal weight and have a low risk for diabetes,  have this test once every three years after 58 years of age.  If you are overweight and have a high risk for diabetes, consider being tested at a younger age or more often. PREVENTING INFECTION  Hepatitis B  If you have a higher risk for hepatitis B, you should be screened for this virus. You are considered at high risk for hepatitis B if:  You were born in a country where hepatitis B is common. Ask your health care provider which countries are considered high risk.  Your parents were born in a high-risk country, and you have not been immunized against hepatitis B (hepatitis B vaccine).  You have HIV or AIDS.  You use needles to inject street drugs.  You live with someone who has hepatitis B.  You have had sex with someone who has hepatitis B.  You get hemodialysis treatment.  You take certain medicines for conditions, including cancer, organ transplantation, and autoimmune conditions. Hepatitis C  Blood testing is recommended for:  Everyone born from [birth year] through 1963.  Anyone with known risk factors for hepatitis C. Sexually transmitted infections (STIs)  You should be screened for sexually transmitted infections (STIs) including gonorrhea and chlamydia if:  You are sexually active and are younger than 58 years of age.  You are older than 58 years of age and your health care provider tells you that you are at risk for this type of infection.  Your sexual activity has changed since you were last screened and you are at an increased risk for chlamydia or gonorrhea. Ask your health care provider if you are at risk.  If you do not have HIV, but are at risk, it may be recommended that you take a prescription medicine daily to prevent HIV infection. This is called pre-exposure prophylaxis (PrEP). You are considered at risk if:  You are sexually active and do not regularly use condoms or know the HIV status of your partner(s).  You take drugs by injection.  You are sexually  active with a partner who has HIV. Talk with your health care provider about whether you are at high risk of being infected with HIV. If you choose to begin PrEP, you should first be tested for HIV. You should then be tested every 3 months for as long as you are taking PrEP.  PREGNANCY   If you are premenopausal and you may become pregnant, ask your health care provider about preconception counseling.  If you may  become pregnant, take 400 to 800 micrograms (mcg) of folic acid every day.  If you want to prevent pregnancy, talk to your health care provider about birth control (contraception). OSTEOPOROSIS AND MENOPAUSE   Osteoporosis is a disease in which the bones lose minerals and strength with aging. This can result in serious bone fractures. Your risk for osteoporosis can be identified using a bone density scan.  If you are 61 years of age or older, or if you are at risk for osteoporosis and fractures, ask your health care provider if you should be screened.  Ask your health care provider whether you should take a calcium or vitamin D supplement to lower your risk for osteoporosis.  Menopause may have certain physical symptoms and risks.  Hormone replacement therapy may reduce some of these symptoms and risks. Talk to your health care provider about whether hormone replacement therapy is right for you.  HOME CARE INSTRUCTIONS   Schedule regular health, dental, and eye exams.  Stay current with your immunizations.   Do not use any tobacco products including cigarettes, chewing tobacco, or electronic cigarettes.  If you are pregnant, do not drink alcohol.  If you are breastfeeding, limit how much and how often you drink alcohol.  Limit alcohol intake to no more than 1 drink per day for nonpregnant women. One drink equals 12 ounces of beer, 5 ounces of wine, or 1 ounces of hard liquor.  Do not use street drugs.  Do not share needles.  Ask your health care provider for help if  you need support or information about quitting drugs.  Tell your health care provider if you often feel depressed.  Tell your health care provider if you have ever been abused or do not feel safe at home.   This information is not intended to replace advice given to you by your health care provider. Make sure you discuss any questions you have with your health care provider.   Document Released: 06/02/2011 Document Revised: 12/08/2014 Document Reviewed: 10/19/2013 Elsevier Interactive Patient Education [employer].

## 2016-10-02 NOTE — Progress Notes (Signed)
Subjective:    Patient ID: Melissa Gilbert, female    DOB: 13-Mar-1956, 60 y.o.   MRN: [id number]  Patient here today for follow up of chronic medical problems. NO changes since last visit. No complaints today.  Outpatient Encounter Prescriptions as of 10/02/2016  Medication Sig  . Black Cohosh 40 MG CAPS Take 40 mg by mouth 3 (three) times daily.  . Cholecalciferol (VITAMIN D3) 2000 UNITS capsule Take 2,000 Units by mouth daily.  Marland Kitchen conjugated estrogens (PREMARIN) vaginal cream Place 1 Applicatorful vaginally daily.  . fexofenadine (ALLEGRA) 180 MG tablet Take 1 tablet (180 mg total) by mouth daily.  Marland Kitchen levothyroxine (SYNTHROID, LEVOTHROID) 125 MCG tablet TAKE 1 TABLET DAILY BEFORE BREAKFAST  . Pitavastatin Calcium (LIVALO) 2 MG TABS Take 1 tablet (2 mg total) by mouth daily.   No facility-administered encounter medications on file as of 10/02/2016.    Hyperlipidemia  This is a chronic problem. The current episode started more than 1 year ago. The problem is controlled. There are no known factors aggravating her hyperlipidemia. Current antihyperlipidemic treatment includes statins. The current treatment provides significant improvement of lipids. There are no compliance problems.  Risk factors for coronary artery disease include post-menopausal.  HYpothyroidism Levothyroxin 136mg. No C/O fatigue GJerrye BushyHas been taking omeprazole OTC which is working great Allergic rhinitis Allegra working well- no complaints  Review of Systems  Constitutional: Negative.   HENT: Negative.   Eyes: Negative.   Respiratory: Negative.   Cardiovascular: Negative.   Gastrointestinal: Negative.   Endocrine: Negative.   Genitourinary: Negative.   Musculoskeletal: Negative.   Skin: Negative.   Allergic/Immunologic: Negative.   Neurological: Negative.   Hematological: Negative.   Psychiatric/Behavioral: Negative.   All other systems reviewed and are negative.      Objective:   Physical Exam   Constitutional: She is oriented to person, place, and time. She appears well-developed and well-nourished.  HENT:  Nose: Nose normal.  Mouth/Throat: Oropharynx is clear and moist.  Eyes: EOM are normal.  Neck: Trachea normal, normal range of motion and full passive range of motion without pain. Neck supple. No JVD present. Carotid bruit is not present. No thyromegaly present.  Cardiovascular: Normal rate, regular rhythm, normal heart sounds and intact distal pulses.  Exam reveals no gallop and no friction rub.   No murmur heard. Pulmonary/Chest: Effort normal and breath sounds normal.  Abdominal: Soft. Bowel sounds are normal. She exhibits no distension and no mass. There is no tenderness.  Musculoskeletal: Normal range of motion.  Lymphadenopathy:    She has no cervical adenopathy.  Neurological: She is alert and oriented to person, place, and time. She has normal reflexes.  Skin: Skin is warm and dry.  Psychiatric: She has a normal mood and affect. Her behavior is normal. Judgment and thought content normal.   BP (!) 96/58   Pulse 64   Temp 97.9 F (36.6 C) (Oral)   Ht _0  (1.753 m)   Wt 191 lb (86.6 kg)   BMI 28.21 kg/m        Assessment & Plan:   1. Chronic seasonal allergic rhinitis due to pollen Allegra d as needed  2. Gastroesophageal reflux disease without esophagitis Avoid spicy foods Do not eat 2 hours prior to bedtime  3. Hypothyroidism due to acquired atrophy of thyroid - levothyroxine (SYNTHROID, LEVOTHROID) 125 MCG tablet; TAKE 1 TABLET DAILY BEFORE BREAKFAST  Dispense: 30 tablet; Refill: 9 - Thyroid Panel With TSH  4. BMI 27.0-27.9,adult Discussed diet and  exercise for person with BMI >25 Will recheck weight in 3-6 months  5. Mixed hyperlipidemia Low fat diet - Pitavastatin Calcium (LIVALO) 2 MG TABS; Take 1 tablet (2 mg total) by mouth daily.  Dispense: 90 tablet; Refill: 1 - CMP14+EGFR - Lipid panel    Labs pending Health maintenance  reviewed Diet and exercise encouraged Continue all meds Follow up  In 6 months   [place], FNP

## 2016-10-03 LAB — CMP14+EGFR
A/G RATIO: 2.1 (ref 1.2–2.2)
ALT: 15 IU/L (ref 0–32)
AST: 25 IU/L (ref 0–40)
Albumin: 4.5 g/dL (ref 3.5–5.5)
Alkaline Phosphatase: 74 IU/L (ref 39–117)
BUN / CREAT RATIO: 19 (ref 9–23)
BUN: 15 mg/dL (ref 6–24)
CALCIUM: 9.4 mg/dL (ref 8.7–10.2)
CHLORIDE: 101 mmol/L (ref 96–106)
CO2: 26 mmol/L (ref 18–29)
Creatinine, Ser: 0.77 mg/dL (ref 0.57–1.00)
GFR, EST AFRICAN AMERICAN: 99 mL/min/{1.73_m2} (ref >59)
GFR, EST NON AFRICAN AMERICAN: 86 mL/min/{1.73_m2} (ref >59)
GLOBULIN, TOTAL: 2.1 g/dL (ref 1.5–4.5)
Glucose: 77 mg/dL (ref 65–99)
POTASSIUM: 4.1 mmol/L (ref 3.5–5.2)
SODIUM: 142 mmol/L (ref 134–144)
TOTAL PROTEIN: 6.6 g/dL (ref 6.0–8.5)

## 2016-10-03 LAB — LIPID PANEL
CHOL/HDL RATIO: 3.3 ratio (ref 0.0–4.4)
Cholesterol, Total: 213 mg/dL — ABNORMAL HIGH (ref 100–199)
HDL: 65 mg/dL (ref >39)
LDL Calculated: 122 mg/dL — ABNORMAL HIGH (ref 0–99)
Triglycerides: 131 mg/dL (ref 0–149)
VLDL Cholesterol Cal: 26 mg/dL (ref 5–40)

## 2016-10-03 LAB — THYROID PANEL WITH TSH
FREE THYROXINE INDEX: 2.1 (ref 1.2–4.9)
T3 UPTAKE RATIO: 26 % (ref 24–39)
T4 TOTAL: 8.1 ug/dL (ref 4.5–12.0)
TSH: 2.38 u[IU]/mL (ref 0.450–4.500)

## 2016-12-31 ENCOUNTER — Encounter: Payer: Federal, State, Local not specified - PPO | Admitting: *Deleted

## 2016-12-31 DIAGNOSIS — Z1231 Encounter for screening mammogram for malignant neoplasm of breast: Secondary | ICD-10-CM | POA: Diagnosis not present

## 2016-12-31 DIAGNOSIS — Z9289 Personal history of other medical treatment: Secondary | ICD-10-CM | POA: Diagnosis not present

## 2017-01-07 DIAGNOSIS — R928 Other abnormal and inconclusive findings on diagnostic imaging of breast: Secondary | ICD-10-CM | POA: Diagnosis not present

## 2017-01-07 DIAGNOSIS — R922 Inconclusive mammogram: Secondary | ICD-10-CM | POA: Diagnosis not present

## 2017-04-23 ENCOUNTER — Ambulatory Visit (INDEPENDENT_AMBULATORY_CARE_PROVIDER_SITE_OTHER): Payer: Federal, State, Local not specified - PPO | Admitting: Nurse Practitioner

## 2017-04-23 ENCOUNTER — Encounter: Payer: Self-pay | Admitting: Nurse Practitioner

## 2017-04-23 VITALS — BP 102/58 | HR 74 | Temp 97.7°F | Ht 69.0 in | Wt 191.0 lb

## 2017-04-23 DIAGNOSIS — Z6827 Body mass index (BMI) 27.0-27.9, adult: Secondary | ICD-10-CM | POA: Diagnosis not present

## 2017-04-23 DIAGNOSIS — K219 Gastro-esophageal reflux disease without esophagitis: Secondary | ICD-10-CM

## 2017-04-23 DIAGNOSIS — E034 Atrophy of thyroid (acquired): Secondary | ICD-10-CM | POA: Diagnosis not present

## 2017-04-23 DIAGNOSIS — J301 Allergic rhinitis due to pollen: Secondary | ICD-10-CM

## 2017-04-23 DIAGNOSIS — E782 Mixed hyperlipidemia: Secondary | ICD-10-CM

## 2017-04-23 MED ORDER — LEVOTHYROXINE SODIUM 125 MCG PO TABS: ORAL_TABLET | ORAL | 11 refills | Status: DC

## 2017-04-23 MED ORDER — PITAVASTATIN CALCIUM 2 MG PO TABS: 2.0000 mg | ORAL_TABLET | Freq: Every day | ORAL | 3 refills | Status: DC

## 2017-04-23 NOTE — Patient Instructions (Signed)
Fat and Cholesterol Restricted Diet Getting too much fat and cholesterol in your diet may cause health problems. Following this diet helps keep your fat and cholesterol at normal levels. This can keep you from getting sick. What types of fat should I choose?  Choose monosaturated and polyunsaturated fats. These are found in foods such as olive oil, canola oil, flaxseeds, walnuts, almonds, and seeds.  Eat more omega-3 fats. Good choices include salmon, mackerel, sardines, tuna, flaxseed oil, and ground flaxseeds.  Limit saturated fats. These are in animal products such as meats, butter, and cream. They can also be in plant products such as palm oil, palm kernel oil, and coconut oil.  Avoid foods with partially hydrogenated oils in them. These contain trans fats. Examples of foods that have trans fats are stick margarine, some tub margarines, cookies, crackers, and other baked goods. What general guidelines do I need to follow?  Check food labels. Look for the words "trans fat" and "saturated fat."  When preparing a meal:  Fill half of your plate with vegetables and green salads.  Fill one fourth of your plate with whole grains. Look for the word "whole" as the first word in the ingredient list.  Fill one fourth of your plate with lean protein foods.  Eat more foods that have fiber, like apples, carrots, beans, peas, and barley.  Eat more home-cooked foods. Eat less at restaurants and buffets.  Limit or avoid alcohol.  Limit foods high in starch and sugar.  Limit fried foods.  Cook foods without frying them. Baking, boiling, grilling, and broiling are all great options.  Lose weight if you are overweight. Losing even a small amount of weight can help your overall health. It can also help prevent diseases such as diabetes and heart disease. What foods can I eat? Grains  Whole grains, such as whole wheat or whole grain breads, crackers, cereals, and pasta. Unsweetened oatmeal,  bulgur, barley, quinoa, or brown rice. Corn or whole wheat flour tortillas. Vegetables  Fresh or frozen vegetables (raw, steamed, roasted, or grilled). Green salads. Fruits  All fresh, canned (in natural juice), or frozen fruits. Meat and Other Protein Products  Ground beef (85% or leaner), grass-fed beef, or beef trimmed of fat. Skinless chicken or turkey. Ground chicken or turkey. Pork trimmed of fat. All fish and seafood. Eggs. Dried beans, peas, or lentils. Unsalted nuts or seeds. Unsalted canned or dry beans. Dairy  Low-fat dairy products, such as skim or 1% milk, 2% or reduced-fat cheeses, low-fat ricotta or cottage cheese, or plain low-fat yogurt. Fats and Oils  Tub margarines without trans fats. Light or reduced-fat mayonnaise and salad dressings. Avocado. Olive, canola, sesame, or safflower oils. Natural peanut or almond butter (choose ones without added sugar and oil). The items listed above may not be a complete list of recommended foods or beverages. Contact your dietitian for more options.  What foods are not recommended? Grains  White bread. White pasta. White rice. Cornbread. Bagels, pastries, and croissants. Crackers that contain trans fat. Vegetables  White potatoes. Corn. Creamed or fried vegetables. Vegetables in a cheese sauce. Fruits  Dried fruits. Canned fruit in light or heavy syrup. Fruit juice. Meat and Other Protein Products  Fatty cuts of meat. Ribs, chicken wings, bacon, sausage, bologna, salami, chitterlings, fatback, hot dogs, bratwurst, and packaged luncheon meats. Liver and organ meats. Dairy  Whole or 2% milk, cream, half-and-half, and cream cheese. Whole milk cheeses. Whole-fat or sweetened yogurt. Full-fat cheeses. Nondairy creamers and whipped   toppings. Processed cheese, cheese spreads, or cheese curds. Sweets and Desserts  Corn syrup, sugars, honey, and molasses. Candy. Jam and jelly. Syrup. Sweetened cereals. Cookies, pies, cakes, donuts, muffins, and ice  cream. Fats and Oils  Butter, stick margarine, lard, shortening, ghee, or bacon fat. Coconut, palm kernel, or palm oils. Beverages  Alcohol. Sweetened drinks (such as sodas, lemonade, and fruit drinks or punches). The items listed above may not be a complete list of foods and beverages to avoid. Contact your dietitian for more information.  This information is not intended to replace advice given to you by your health care provider. Make sure you discuss any questions you have with your health care provider. Document Released: 05/18/2012 Document Revised: 07/24/2016 Document Reviewed: 02/16/2014 Elsevier Interactive Patient Education  2017 Elsevier Inc.  

## 2017-04-23 NOTE — Progress Notes (Signed)
Subjective:    Patient ID: SHYA KOVATCH, female    DOB: 03/03/1956, 61 y.o.   MRN: [id number]  HPI Melissa Gilbert is here today for follow up of chronic medical problem.  Outpatient Encounter Prescriptions as of 04/23/2017  Medication Sig  . Black Cohosh 40 MG CAPS Take 40 mg by mouth 3 (three) times daily.  . Cholecalciferol (VITAMIN D3) 2000 UNITS capsule Take 2,000 Units by mouth daily.  Marland Kitchen conjugated estrogens (PREMARIN) vaginal cream Place 1 Applicatorful vaginally daily.  . fexofenadine (ALLEGRA) 180 MG tablet Take 1 tablet (180 mg total) by mouth daily.  Marland Kitchen levothyroxine (SYNTHROID, LEVOTHROID) 125 MCG tablet TAKE 1 TABLET DAILY BEFORE BREAKFAST  . Pitavastatin Calcium (LIVALO) 2 MG TABS Take 1 tablet (2 mg total) by mouth daily.   No facility-administered encounter medications on file as of 04/23/2017.     1. Mixed hyperlipidemia  Patient taking Livalo and maintaining low fat diet.  2. Gastroesophageal reflux disease without esophagitis Patient manages with OTC antacids and H2 receptor blockers.   3. Seasonal allergic rhinitis due to pollen  Patient taking Allegra for symptom management.  4. Hypothyroidism due to acquired atrophy of thyroid Taking levothyroxine for management.   5. BMI 27.0-27.9,adult  No recent weight gain or loss.    New complaints: None today.    Review of Systems  Constitutional: Negative for activity change, appetite change and fatigue.  Respiratory: Negative for cough and shortness of breath.   Cardiovascular: Negative for chest pain and palpitations.  Gastrointestinal: Negative for abdominal distention, abdominal pain, nausea and vomiting.  Neurological: Negative for dizziness and headaches.  All other systems reviewed and are negative.      Objective:   Physical Exam  Constitutional: She is oriented to person, place, and time. She appears well-developed and well-nourished. No distress.  HENT:  Head: Normocephalic.  Nose: Nose  normal.  Mouth/Throat: Oropharynx is clear and moist.  Eyes: Pupils are equal, round, and reactive to light.  Patient has left pupil abnormality.  Neck: Normal range of motion. Neck supple. No JVD present. No thyromegaly present.  Cardiovascular: Normal rate, regular rhythm and normal heart sounds.   No murmur heard. Pulmonary/Chest: Effort normal and breath sounds normal. No respiratory distress.  Abdominal: Soft. Bowel sounds are normal. She exhibits no distension. There is no tenderness.  Musculoskeletal: Normal range of motion.  Lymphadenopathy:    She has no cervical adenopathy.  Neurological: She is alert and oriented to person, place, and time.  Skin: Skin is warm and dry. No rash noted.  Psychiatric: She has a normal mood and affect. Her behavior is normal. Judgment and thought content normal.   BP (!) 102/58   Pulse 74   Temp 97.7 F (36.5 C) (Oral)   Ht _0  (1.753 m)   Wt 191 lb (86.6 kg)   BMI 28.21 kg/m      Assessment & Plan:  1. Mixed hyperlipidemia Low fat diet - Pitavastatin Calcium (LIVALO) 2 MG TABS; Take 1 tablet (2 mg total) by mouth daily.  Dispense: 90 tablet; Refill: 3 - CMP14+EGFR - Lipid panel  2. Gastroesophageal reflux disease without esophagitis Avoid spicy foods Do not eat 2 hours prior to bedtime  3. Seasonal allergic rhinitis due to pollen  4. Hypothyroidism due to acquired atrophy of thyroid - levothyroxine (SYNTHROID, LEVOTHROID) 125 MCG tablet; TAKE 1 TABLET DAILY BEFORE BREAKFAST  Dispense: 30 tablet; Refill: 11 - Thyroid Panel With TSH  5. BMI 27.0-27.9,adult Discussed diet  and exercise for person with BMI >25 Will recheck weight in 3-6 months     Labs pending Health maintenance reviewed Diet and exercise encouraged Continue all meds Follow up  In 6 months   Mary-Margaret Korell, FNP   

## 2017-04-24 LAB — CMP14+EGFR
A/G RATIO: 2 (ref 1.2–2.2)
ALBUMIN: 4.5 g/dL (ref 3.5–5.5)
ALT: 16 IU/L (ref 0–32)
AST: 22 IU/L (ref 0–40)
Alkaline Phosphatase: 76 IU/L (ref 39–117)
BUN / CREAT RATIO: 17 (ref 9–23)
BUN: 18 mg/dL (ref 6–24)
Bilirubin Total: 0.3 mg/dL (ref 0.0–1.2)
CALCIUM: 9.5 mg/dL (ref 8.7–10.2)
CO2: 25 mmol/L (ref 18–29)
CREATININE: 1.04 mg/dL — AB (ref 0.57–1.00)
Chloride: 102 mmol/L (ref 96–106)
GFR, EST AFRICAN AMERICAN: 68 mL/min/{1.73_m2} (ref >59)
GFR, EST NON AFRICAN AMERICAN: 59 mL/min/{1.73_m2} — AB (ref >59)
GLOBULIN, TOTAL: 2.2 g/dL (ref 1.5–4.5)
Glucose: 87 mg/dL (ref 65–99)
POTASSIUM: 4 mmol/L (ref 3.5–5.2)
SODIUM: 140 mmol/L (ref 134–144)
TOTAL PROTEIN: 6.7 g/dL (ref 6.0–8.5)

## 2017-04-24 LAB — LIPID PANEL
CHOL/HDL RATIO: 3 ratio (ref 0.0–4.4)
Cholesterol, Total: 207 mg/dL — ABNORMAL HIGH (ref 100–199)
HDL: 69 mg/dL (ref >39)
LDL CALC: 118 mg/dL — AB (ref 0–99)
TRIGLYCERIDES: 99 mg/dL (ref 0–149)
VLDL Cholesterol Cal: 20 mg/dL (ref 5–40)

## 2017-04-24 LAB — THYROID PANEL WITH TSH
FREE THYROXINE INDEX: 2.1 (ref 1.2–4.9)
T3 UPTAKE RATIO: 25 % (ref 24–39)
T4, Total: 8.5 ug/dL (ref 4.5–12.0)
TSH: 2.23 u[IU]/mL (ref 0.450–4.500)

## 2017-10-15 DIAGNOSIS — L814 Other melanin hyperpigmentation: Secondary | ICD-10-CM | POA: Diagnosis not present

## 2017-10-15 DIAGNOSIS — D225 Melanocytic nevi of trunk: Secondary | ICD-10-CM | POA: Diagnosis not present

## 2017-10-15 DIAGNOSIS — D1801 Hemangioma of skin and subcutaneous tissue: Secondary | ICD-10-CM | POA: Diagnosis not present

## 2017-10-15 DIAGNOSIS — L570 Actinic keratosis: Secondary | ICD-10-CM | POA: Diagnosis not present

## 2017-10-15 DIAGNOSIS — L820 Inflamed seborrheic keratosis: Secondary | ICD-10-CM | POA: Diagnosis not present

## 2017-10-15 DIAGNOSIS — B353 Tinea pedis: Secondary | ICD-10-CM | POA: Diagnosis not present

## 2018-01-25 ENCOUNTER — Encounter: Payer: Self-pay | Admitting: Nurse Practitioner

## 2018-01-25 DIAGNOSIS — Z1231 Encounter for screening mammogram for malignant neoplasm of breast: Secondary | ICD-10-CM | POA: Diagnosis not present

## 2018-03-31 DIAGNOSIS — Z124 Encounter for screening for malignant neoplasm of cervix: Secondary | ICD-10-CM | POA: Diagnosis not present

## 2018-03-31 DIAGNOSIS — Z6825 Body mass index (BMI) 25.0-25.9, adult: Secondary | ICD-10-CM | POA: Diagnosis not present

## 2018-03-31 DIAGNOSIS — Z01419 Encounter for gynecological examination (general) (routine) without abnormal findings: Secondary | ICD-10-CM | POA: Diagnosis not present

## 2018-04-23 ENCOUNTER — Telehealth: Payer: Self-pay | Admitting: Nurse Practitioner

## 2018-04-23 ENCOUNTER — Other Ambulatory Visit: Payer: Self-pay | Admitting: Nurse Practitioner

## 2018-04-23 DIAGNOSIS — E782 Mixed hyperlipidemia: Secondary | ICD-10-CM

## 2018-04-23 DIAGNOSIS — E034 Atrophy of thyroid (acquired): Secondary | ICD-10-CM

## 2018-04-23 NOTE — Telephone Encounter (Signed)
Patient notified that orders placed in computer

## 2018-04-23 NOTE — Telephone Encounter (Signed)
Orders put in computer.

## 2018-05-01 ENCOUNTER — Other Ambulatory Visit: Payer: Self-pay | Admitting: Nurse Practitioner

## 2018-05-01 DIAGNOSIS — E782 Mixed hyperlipidemia: Secondary | ICD-10-CM

## 2018-05-01 DIAGNOSIS — E034 Atrophy of thyroid (acquired): Secondary | ICD-10-CM

## 2018-05-03 NOTE — Telephone Encounter (Signed)
OV 05/24/18

## 2018-05-19 ENCOUNTER — Other Ambulatory Visit: Payer: Federal, State, Local not specified - PPO

## 2018-05-19 DIAGNOSIS — E034 Atrophy of thyroid (acquired): Secondary | ICD-10-CM | POA: Diagnosis not present

## 2018-05-19 DIAGNOSIS — E782 Mixed hyperlipidemia: Secondary | ICD-10-CM

## 2018-05-20 LAB — LIPID PANEL
CHOL/HDL RATIO: 2.9 ratio (ref 0.0–4.4)
Cholesterol, Total: 212 mg/dL — ABNORMAL HIGH (ref 100–199)
HDL: 72 mg/dL (ref >39)
LDL CALC: 130 mg/dL — AB (ref 0–99)
Triglycerides: 51 mg/dL (ref 0–149)
VLDL CHOLESTEROL CAL: 10 mg/dL (ref 5–40)

## 2018-05-20 LAB — CMP14+EGFR
ALK PHOS: 80 IU/L (ref 39–117)
ALT: 15 IU/L (ref 0–32)
AST: 21 IU/L (ref 0–40)
Albumin/Globulin Ratio: 2.1 (ref 1.2–2.2)
Albumin: 4.4 g/dL (ref 3.5–5.5)
BUN/Creatinine Ratio: 14 (ref 9–23)
BUN: 14 mg/dL (ref 6–24)
Bilirubin Total: 0.5 mg/dL (ref 0.0–1.2)
CALCIUM: 9.8 mg/dL (ref 8.7–10.2)
CO2: 24 mmol/L (ref 20–29)
CREATININE: 0.97 mg/dL (ref 0.57–1.00)
Chloride: 98 mmol/L (ref 96–106)
GFR calc Af Amer: 74 mL/min/{1.73_m2} (ref >59)
GFR, EST NON AFRICAN AMERICAN: 64 mL/min/{1.73_m2} (ref >59)
GLUCOSE: 98 mg/dL (ref 65–99)
Globulin, Total: 2.1 g/dL (ref 1.5–4.5)
Potassium: 4.5 mmol/L (ref 3.5–5.2)
Sodium: 139 mmol/L (ref 134–144)
Total Protein: 6.5 g/dL (ref 6.0–8.5)

## 2018-05-20 LAB — THYROID PANEL WITH TSH
Free Thyroxine Index: 2.8 (ref 1.2–4.9)
T3 Uptake Ratio: 28 % (ref 24–39)
T4 TOTAL: 10.1 ug/dL (ref 4.5–12.0)
TSH: 3.14 u[IU]/mL (ref 0.450–4.500)

## 2018-05-24 ENCOUNTER — Encounter: Payer: Self-pay | Admitting: Nurse Practitioner

## 2018-05-24 ENCOUNTER — Ambulatory Visit: Payer: Federal, State, Local not specified - PPO | Admitting: Nurse Practitioner

## 2018-05-24 VITALS — BP 114/66 | HR 66 | Temp 98.3°F | Ht 69.0 in | Wt 173.0 lb

## 2018-05-24 DIAGNOSIS — Z6825 Body mass index (BMI) 25.0-25.9, adult: Secondary | ICD-10-CM

## 2018-05-24 DIAGNOSIS — J301 Allergic rhinitis due to pollen: Secondary | ICD-10-CM | POA: Diagnosis not present

## 2018-05-24 DIAGNOSIS — Z1211 Encounter for screening for malignant neoplasm of colon: Secondary | ICD-10-CM

## 2018-05-24 DIAGNOSIS — Z1212 Encounter for screening for malignant neoplasm of rectum: Secondary | ICD-10-CM

## 2018-05-24 DIAGNOSIS — E034 Atrophy of thyroid (acquired): Secondary | ICD-10-CM | POA: Diagnosis not present

## 2018-05-24 DIAGNOSIS — E782 Mixed hyperlipidemia: Secondary | ICD-10-CM

## 2018-05-24 DIAGNOSIS — K219 Gastro-esophageal reflux disease without esophagitis: Secondary | ICD-10-CM

## 2018-05-24 MED ORDER — LEVOTHYROXINE SODIUM 125 MCG PO TABS: 125.0000 ug | ORAL_TABLET | Freq: Every day | ORAL | 0 refills | Status: DC

## 2018-05-24 MED ORDER — ROSUVASTATIN CALCIUM 10 MG PO TABS: 10.0000 mg | ORAL_TABLET | Freq: Every day | ORAL | 1 refills | Status: DC

## 2018-05-24 NOTE — Addendum Note (Signed)
Addended by: Bennie PieriniMARTIN, MARY-MARGARET on: 05/24/2018 04:44 PM   Modules accepted: Orders

## 2018-05-24 NOTE — Progress Notes (Signed)
Subjective:    Patient ID: Melissa Gilbert, female    DOB: [birth year], 62 y.o.   MRN: [id number]   Chief Complaint: medical management of chronic issues  HPI:  1. Gastroesophageal reflux disease without esophagitis  Is currently not taking any meds- does OTC meds as needed  2. Hypothyroidism due to acquired atrophy of thyroid  not having any problems that she is aware of.  3. Mixed hyperlipidemia  Tries to avoid fried food son a regular basis. Dos very little exercise.  4. BMI 27.0-27.9,adult  Weight down 18lbs since last visit  5. Seasonal allergic rhinitis due to pollen  Takes allegra daily    Outpatient Encounter Medications as of 05/24/2018  Medication Sig  . Black Cohosh 40 MG CAPS Take 40 mg by mouth 3 (three) times daily.  . Cholecalciferol (VITAMIN D3) 2000 UNITS capsule Take 2,000 Units by mouth daily.  Marland Kitchen. conjugated estrogens (PREMARIN) vaginal cream Place 1 Applicatorful vaginally daily.  . fexofenadine (ALLEGRA) 180 MG tablet Take 1 tablet (180 mg total) by mouth daily.  Marland Kitchen. levothyroxine (SYNTHROID, LEVOTHROID) 125 MCG tablet TAKE 1 TABLET DAILY BEFORE BREAKFAST  . LIVALO 2 MG TABS TAKE 1 TABLET DAILY      New complaints: None today  Social history:  lives with her husband and her son. Both of her sons are on drugs and she worries all the time.   Review of Systems  Constitutional: Negative for activity change and appetite change.  HENT: Negative.   Eyes: Negative for pain.  Respiratory: Negative for shortness of breath.   Cardiovascular: Negative for chest pain, palpitations and leg swelling.  Gastrointestinal: Negative for abdominal pain.  Endocrine: Negative for polydipsia.  Genitourinary: Negative.   Skin: Negative for rash.  Neurological: Negative for dizziness, weakness and headaches.  Hematological: Does not bruise/bleed easily.  Psychiatric/Behavioral: Negative.   All other systems reviewed and are negative.      Objective:   Physical Exam    Constitutional: She is oriented to person, place, and time. She appears well-developed and well-nourished. No distress.  HENT:  Head: Normocephalic.  Nose: Nose normal.  Mouth/Throat: Oropharynx is clear and moist.  Eyes: Pupils are equal, round, and reactive to light. EOM are normal.  Neck: Normal range of motion. Neck supple. No JVD present. Carotid bruit is not present.  Cardiovascular: Normal rate, regular rhythm, normal heart sounds and intact distal pulses.  Pulmonary/Chest: Effort normal and breath sounds normal. No respiratory distress. She has no wheezes. She has no rales. She exhibits no tenderness.  Abdominal: Soft. Normal appearance, normal aorta and bowel sounds are normal. She exhibits no distension, no abdominal bruit, no pulsatile midline mass and no mass. There is no splenomegaly or hepatomegaly. There is no tenderness.  Musculoskeletal: Normal range of motion. She exhibits no edema.  Lymphadenopathy:    She has no cervical adenopathy.  Neurological: She is alert and oriented to person, place, and time. She has normal reflexes.  Skin: Skin is warm and dry.  Psychiatric: She has a normal mood and affect. Her behavior is normal. Judgment and thought content normal.  Nursing note and vitals reviewed.   BP 114/66   Pulse 66   Temp 98.3 F (36.8 C) (Oral)   Ht 5\' 9"  (1.753 m)   Wt 173 lb (78.5 kg)   BMI 25.55 kg/m        Assessment & Plan:  Melissa Gilbert comes in today with chief complaint of No chief complaint on file.  Diagnosis and orders addressed:  1. Gastroesophageal reflux disease without esophagitis Avoid spicy foods Do not eat 2 hours prior to bedtime  2. Hypothyroidism due to acquired atrophy of thyroid - levothyroxine (SYNTHROID, LEVOTHROID) 125 MCG tablet; Take 1 tablet (125 mcg total) by mouth daily before breakfast.  Dispense: 30 tablet; Refill: 0  3. Mixed hyperlipidemia Low fat diet - rosuvastatin (CRESTOR) 10 MG tablet; Take 1 tablet (10  mg total) by mouth daily.  Dispense: 90 tablet; Refill: 1  4. BMI 25.0-25.9,adult Discussed diet and exercise for person with BMI >25 Will recheck weight in 3-6 months  5. Seasonal allergic rhinitis due to pollen Continue allegra as rx   Labs pending Health Maintenance reviewed Diet and exercise encouraged  Follow up plan: 6 months   Mary-Margaret Daphine Deutscher, FNP

## 2018-05-24 NOTE — Patient Instructions (Signed)
Fat and Cholesterol Restricted Diet High levels of fat and cholesterol in your blood may lead to various health problems, such as diseases of the heart, blood vessels, gallbladder, liver, and pancreas. Fats are concentrated sources of energy that come in various forms. Certain types of fat, including saturated fat, may be harmful in excess. Cholesterol is a substance needed by your body in small amounts. Your body makes all the cholesterol it needs. Excess cholesterol comes from the food you eat. When you have high levels of cholesterol and saturated fat in your blood, health problems can develop because the excess fat and cholesterol will gather along the walls of your blood vessels, causing them to narrow. Choosing the right foods will help you control your intake of fat and cholesterol. This will help keep the levels of these substances in your blood within normal limits and reduce your risk of disease. What is my plan? Your health care provider recommends that you:  Limit your fat intake to ______% or less of your total calories per day.  Limit the amount of cholesterol in your diet to less than _________mg per day.  Eat 20-30 grams of fiber each day.  What types of fat should I choose?  Choose healthy fats more often. Choose monounsaturated and polyunsaturated fats, such as olive and canola oil, flaxseeds, walnuts, almonds, and seeds.  Eat more omega-3 fats. Good choices include salmon, mackerel, sardines, tuna, flaxseed oil, and ground flaxseeds. Aim to eat fish at least two times a week.  Limit saturated fats. Saturated fats are primarily found in animal products, such as meats, butter, and cream. Plant sources of saturated fats include palm oil, palm kernel oil, and coconut oil.  Avoid foods with partially hydrogenated oils in them. These contain trans fats. Examples of foods that contain trans fats are stick margarine, some tub margarines, cookies, crackers, and other baked goods. What  general guidelines do I need to follow? These guidelines for healthy eating will help you control your intake of fat and cholesterol:  Check food labels carefully to identify foods with trans fats or high amounts of saturated fat.  Fill one half of your plate with vegetables and green salads.  Fill one fourth of your plate with whole grains. Look for the word "whole" as the first word in the ingredient list.  Fill one fourth of your plate with lean protein foods.  Limit fruit to two servings a day. Choose fruit instead of juice.  Eat more foods that contain fiber, such as apples, broccoli, carrots, beans, peas, and barley.  Eat more home-cooked food and less restaurant, buffet, and fast food.  Limit or avoid alcohol.  Limit foods high in starch and sugar.  Limit fried foods.  Cook foods using methods other than frying. Baking, boiling, grilling, and broiling are all great options.  Lose weight if you are overweight. Losing just 5-10% of your initial body weight can help your overall health and prevent diseases such as diabetes and heart disease.  What foods can I eat? Grains  Whole grains, such as whole wheat or whole grain breads, crackers, cereals, and pasta. Unsweetened oatmeal, bulgur, barley, quinoa, or brown rice. Corn or whole wheat flour tortillas. Vegetables  Fresh or frozen vegetables (raw, steamed, roasted, or grilled). Green salads. Fruits  All fresh, canned (in natural juice), or frozen fruits. Meats and other protein foods  Ground beef (85% or leaner), grass-fed beef, or beef trimmed of fat. Skinless chicken or turkey. Ground chicken or turkey.   Pork trimmed of fat. All fish and seafood. Eggs. Dried beans, peas, or lentils. Unsalted nuts or seeds. Unsalted canned or dry beans. Dairy  Low-fat dairy products, such as skim or 1% milk, 2% or reduced-fat cheeses, low-fat ricotta or cottage cheese, or plain low-fat yo Fats and oils  Tub margarines without trans  fats. Light or reduced-fat mayonnaise and salad dressings. Avocado. Olive, canola, sesame, or safflower oils. Natural peanut or almond butter (choose ones without added sugar and oil). The items listed above may not be a complete list of recommended foods or beverages. Contact your dietitian for more options. Foods to avoid Grains  White bread. White pasta. White rice. Cornbread. Bagels, pastries, and croissants. Crackers that contain trans fat. Vegetables  White potatoes. Corn. Creamed or fried vegetables. Vegetables in a cheese sauce. Fruits  Dried fruits. Canned fruit in light or heavy syrup. Fruit juice. Meats and other protein foods  Fatty cuts of meat. Ribs, chicken wings, bacon, sausage, bologna, salami, chitterlings, fatback, hot dogs, bratwurst, and packaged luncheon meats. Liver and organ meats. Dairy  Whole or 2% milk, cream, half-and-half, and cream cheese. Whole milk cheeses. Whole-fat or sweetened yogurt. Full-fat cheeses. Nondairy creamers and whipped toppings. Processed cheese, cheese spreads, or cheese curds. Beverages  Alcohol. Sweetened drinks (such as sodas, lemonade, and fruit drinks or punches). Fats and oils  Butter, stick margarine, lard, shortening, ghee, or bacon fat. Coconut, palm kernel, or palm oils. Sweets and desserts  Corn syrup, sugars, honey, and molasses. Candy. Jam and jelly. Syrup. Sweetened cereals. Cookies, pies, cakes, donuts, muffins, and ice cream. The items listed above may not be a complete list of foods and beverages to avoid. Contact your dietitian for more information. This information is not intended to replace advice given to you by your health care provider. Make sure you discuss any questions you have with your health care provider. Document Released: 11/17/2005 Document Revised: 12/08/2014 Document Reviewed: 02/15/2014 Elsevier Interactive Patient Education  2018 [employer] Inc.  

## 2018-06-09 ENCOUNTER — Telehealth: Payer: Self-pay | Admitting: Nurse Practitioner

## 2018-06-09 ENCOUNTER — Encounter: Payer: Self-pay | Admitting: *Deleted

## 2018-06-09 NOTE — Telephone Encounter (Signed)
Pt has not received Cologuard Please reorder

## 2018-06-09 NOTE — Telephone Encounter (Signed)
Spoke with patient and provided her with the [facility] number, [phone]. They should still have a valid order and will be able to ship her test kit once they talk with her.

## 2018-06-09 NOTE — Telephone Encounter (Signed)
PT received an robo call from [employer] and she accidentally hung up on that call and never has got a call back and she has not been able to do the test yet. She is wanting to know what she needs to do.

## 2018-06-22 LAB — COLOGUARD: Cologuard: NEGATIVE

## 2018-07-06 ENCOUNTER — Other Ambulatory Visit: Payer: Self-pay | Admitting: Nurse Practitioner

## 2018-07-06 DIAGNOSIS — E034 Atrophy of thyroid (acquired): Secondary | ICD-10-CM

## 2018-07-15 ENCOUNTER — Telehealth: Payer: Self-pay | Admitting: Nurse Practitioner

## 2018-07-15 NOTE — Telephone Encounter (Signed)
Melissa Gilbert, I do not see the results of her cologuard.  Please help.

## 2018-07-16 NOTE — Telephone Encounter (Signed)
Her results were normal. I have printed a copy of the report to scan in. The original report should have came over on the fax but I haven't seen it.

## 2018-07-16 NOTE — Telephone Encounter (Signed)
Patient notified that result was negative. Apologized for the late notice.

## 2018-08-16 DIAGNOSIS — K080 Exfoliation of teeth due to systemic causes: Secondary | ICD-10-CM | POA: Diagnosis not present

## 2018-08-17 DIAGNOSIS — K080 Exfoliation of teeth due to systemic causes: Secondary | ICD-10-CM | POA: Diagnosis not present

## 2018-10-14 DIAGNOSIS — I788 Other diseases of capillaries: Secondary | ICD-10-CM | POA: Diagnosis not present

## 2018-10-14 DIAGNOSIS — D225 Melanocytic nevi of trunk: Secondary | ICD-10-CM | POA: Diagnosis not present

## 2018-10-14 DIAGNOSIS — D1801 Hemangioma of skin and subcutaneous tissue: Secondary | ICD-10-CM | POA: Diagnosis not present

## 2018-10-14 DIAGNOSIS — L821 Other seborrheic keratosis: Secondary | ICD-10-CM | POA: Diagnosis not present

## 2018-10-14 DIAGNOSIS — L570 Actinic keratosis: Secondary | ICD-10-CM | POA: Diagnosis not present

## 2018-12-03 ENCOUNTER — Ambulatory Visit: Payer: Federal, State, Local not specified - PPO | Admitting: Nurse Practitioner

## 2018-12-03 ENCOUNTER — Encounter: Payer: Self-pay | Admitting: Nurse Practitioner

## 2018-12-03 VITALS — BP 106/64 | HR 55 | Temp 97.1°F | Ht 69.0 in | Wt 179.0 lb

## 2018-12-03 DIAGNOSIS — Z6827 Body mass index (BMI) 27.0-27.9, adult: Secondary | ICD-10-CM | POA: Diagnosis not present

## 2018-12-03 DIAGNOSIS — E034 Atrophy of thyroid (acquired): Secondary | ICD-10-CM | POA: Diagnosis not present

## 2018-12-03 DIAGNOSIS — K219 Gastro-esophageal reflux disease without esophagitis: Secondary | ICD-10-CM

## 2018-12-03 DIAGNOSIS — M7701 Medial epicondylitis, right elbow: Secondary | ICD-10-CM

## 2018-12-03 DIAGNOSIS — E782 Mixed hyperlipidemia: Secondary | ICD-10-CM | POA: Diagnosis not present

## 2018-12-03 DIAGNOSIS — M542 Cervicalgia: Secondary | ICD-10-CM

## 2018-12-03 MED ORDER — ROSUVASTATIN CALCIUM 10 MG PO TABS: 10.0000 mg | ORAL_TABLET | Freq: Every day | ORAL | 1 refills | Status: DC

## 2018-12-03 MED ORDER — DICLOFENAC SODIUM 1 % TD GEL: 4.0000 g | Freq: Four times a day (QID) | TRANSDERMAL | 1 refills | Status: DC

## 2018-12-03 MED ORDER — BACLOFEN 10 MG PO TABS: 10.0000 mg | ORAL_TABLET | Freq: Three times a day (TID) | ORAL | 0 refills | Status: DC

## 2018-12-03 NOTE — Patient Instructions (Signed)
Golfer's Elbow    Golfer's elbow, also called medial epicondylitis, is a condition that results from inflammation of the strong bands of tissue (tendons) that attach your forearm muscles to the inside of your bone at the elbow. These tendons affect the muscles that bend the palm toward the wrist (flexion).  This condition is called golfer's elbow because it is more common among people who constantly bend and twist their wrists, such as golfers. This injury usually results from overuse. Tendons also become less flexible with age.  This condition causes elbow pain that may spread to your forearm and upper arm. The pain may get worse when you bend your wrist downward.  What are the causes?  This condition is an overuse injury that is caused by:  Repeatedly flexing, turning, or twisting your wrist.  Constantly gripping objects with your hands.  What increases the risk?  This condition is more likely to develop in people who play golf or tennis or have jobs that require the constant use of their hands. This injury is more common among:  Carpenters.  Gardeners.  Musicians.  Bricklayers.  Typists.  What are the signs or symptoms?  Symptoms of this condition include:  Pain near the inner elbow or forearm.  Reduced grip strength.  How is this diagnosed?  This condition is diagnosed based on your symptoms, medical history, and physical exam. During the exam, your health care provider may test your grip strength and move your wrist to check for pain. You may also have an MRI to confirm the diagnosis, look for other issues, and check for tears in the ligaments, muscles, or tendons.  How is this treated?  Treatment for this condition includes:  Stopping all activities that make you bend or twist your wrist until your pain and other symptoms go away.  Icing your wrist to relieve pain.  Taking NSAIDs or getting corticosteroid injections to reduce pain and swelling.  Doing stretches, range-of-motion, and strengthening exercises  (physical therapy) as told by your health care provider.  In rare cases, surgery may be needed if your condition does not improve.  Follow these instructions at home:  If directed, apply ice to the injured area.  Put ice in a plastic bag.  Place a towel between your skin and the bag.  Leave the ice on for 20 minutes, 2-3 times a day.  Move your fingers often to avoid stiffness.  Raise (elevate) the injured area above the level of your heart while you are sitting or lying down.  Return to your normal activities as told by your health care provider. Ask your health care provider what activities are safe for you.  Do exercises as told by your health care provider.  Do not use tobacco products, including cigarettes, chewing tobacco, or e-cigarettes. If you need help quitting, ask your health care provider.  Take over-the-counter and prescription medicines only as told by your health care provider.  Keep all follow-up visits as told by your health care provider. This is important.  How is this prevented?  Warm up and stretch before being active.  Cool down and stretch after being active.  Give your body time to rest between periods of activity.  Make sure to use equipment that fits you.  Be safe and responsible while being active to avoid falls.  Do at least 150 minutes of moderate-intensity exercise each week, such as brisk walking or water aerobics.  Maintain physical fitness, including:  Strength.  Flexibility.  Cardiovascular fitness.    Endurance.  Perform exercises to strengthen the forearm muscles.  Slow your golf swing to reduce shock in the arm when making contact with the ball, if you play golf.  Contact a health care provider if:  Your pain does not improve or it gets worse.  You notice numbness in your hand.  Get help right away if:  Your pain is severe.  You cannot move your wrist.  This information is not intended to replace advice given to you by your health care provider. Make sure you discuss any questions  you have with your health care provider.  Document Released: 11/17/2005 Document Revised: 08/06/2018 Document Reviewed: 07/30/2015  Elsevier Interactive Patient Education  2019 [employer]

## 2018-12-03 NOTE — Progress Notes (Signed)
 Subjective:    Patient ID: Melissa Gilbert, female    DOB: 11/30/1956, 62 y.o.   MRN: [id number]   Chief Complaint: medical management if chronic issues  HPI:  1. Gastroesophageal reflux disease without esophagitis  Only takes OTC meds when needed. mainly controls with diet.  2. Hypothyroidism due to acquired atrophy of thyroid  No problems that she is aware of. Last TSH was 3.1  3. Mixed hyperlipidemia  Does not watch diet and does very little exercise. She stays busy but does not exercise.  4. BMI 27.0-27.9,adult  No recent weight chnages    Outpatient Encounter Medications as of 12/03/2018  Medication Sig  . Black Cohosh 40 MG CAPS Take 40 mg by mouth 3 (three) times daily.  . Cholecalciferol (VITAMIN D3) 2000 UNITS capsule Take 2,000 Units by mouth daily.  . conjugated estrogens (PREMARIN) vaginal cream Place 1 Applicatorful vaginally daily.  . fexofenadine (ALLEGRA) 180 MG tablet Take 1 tablet (180 mg total) by mouth daily.  . levothyroxine (SYNTHROID, LEVOTHROID) 125 MCG tablet TAKE (1) TABLET DAILY WITH BREAKFAST.  . rosuvastatin (CRESTOR) 10 MG tablet Take 1 tablet (10 mg total) by mouth daily.      New complaints: - Neck pain- has been going on for years- has gotten worse. Neck feels stiff. Rates pain 2/10. More stiffness then pain. - right medial elbow painful and numbness down arm to 5th finger. Holding arm straight helps.  Social history: Lives with husband. Has 2 sons that are inolved in drugs  Review of Systems  Constitutional: Negative for activity change and appetite change.  HENT: Negative.   Eyes: Negative for pain.  Respiratory: Negative for shortness of breath.   Cardiovascular: Negative for chest pain, palpitations and leg swelling.  Gastrointestinal: Negative for abdominal pain.  Endocrine: Negative for polydipsia.  Genitourinary: Negative.   Skin: Negative for rash.  Neurological: Negative for dizziness, weakness and headaches.  Hematological:  Does not bruise/bleed easily.  Psychiatric/Behavioral: Negative.   All other systems reviewed and are negative.      Objective:   Physical Exam Vitals signs and nursing note reviewed.  Constitutional:      General: She is not in acute distress.    Appearance: Normal appearance. She is well-developed.  HENT:     Head: Normocephalic.     Nose: Nose normal.  Eyes:     Pupils: Pupils are equal, round, and reactive to light.  Neck:     Musculoskeletal: Normal range of motion and neck supple.     Vascular: No carotid bruit or JVD.  Cardiovascular:     Rate and Rhythm: Normal rate and regular rhythm.     Heart sounds: Normal heart sounds.  Pulmonary:     Effort: Pulmonary effort is normal. No respiratory distress.     Breath sounds: Normal breath sounds. No wheezing or rales.  Chest:     Chest wall: No tenderness.  Abdominal:     General: Bowel sounds are normal. There is no distension or abdominal bruit.     Palpations: Abdomen is soft. There is no hepatomegaly, splenomegaly, mass or pulsatile mass.     Tenderness: There is no abdominal tenderness.  Musculoskeletal: Normal range of motion.     Comments: Mild tenderness right medial elbow. Sensation intact distally.   Lymphadenopathy:     Cervical: No cervical adenopathy.  Skin:    General: Skin is warm and dry.  Neurological:     Mental Status: She is alert and oriented   to person, place, and time.     Deep Tendon Reflexes: Reflexes are normal and symmetric.  Psychiatric:        Behavior: Behavior normal.        Thought Content: Thought content normal.        Judgment: Judgment normal.    BP 106/64   Pulse (!) 55   Temp (!) 97.1 F (36.2 C) (Oral)   Ht 5' 9" (1.753 m)   Wt 179 lb (81.2 kg)   BMI 26.43 kg/m      Assessment & Plan:  Melissa Gilbert comes in today with chief complaint of Medical Management of Chronic Issues   Diagnosis and orders addressed:  1. Gastroesophageal reflux disease without  esophagitis Avoid spicy foods Do not eat 2 hours prior to bedtime  2. Hypothyroidism due to acquired atrophy of thyroid - Thyroid Panel With TSH  3. Mixed hyperlipidemia Low fat diet - rosuvastatin (CRESTOR) 10 MG tablet; Take 1 tablet (10 mg total) by mouth daily.  Dispense: 90 tablet; Refill: 1 - CMP14+EGFR - Lipid panel  4. BMI 27.0-27.9,adult Discussed diet and exercise for person with BMI >25 Will recheck weight in 3-6 months  5. Golfers elbow of right upper extremity Elbow strap Heat if helps - diclofenac sodium (VOLTAREN) 1 % GEL; Apply 4 g topically 4 (four) times daily.  Dispense: 500 g; Refill: 1  6. Neck pain Moist heat rest - baclofen (LIORESAL) 10 MG tablet; Take 1 tablet (10 mg total) by mouth 3 (three) times daily.  Dispense: 30 each; Refill: 0   Labs pending Health Maintenance reviewed Diet and exercise encouraged  Follow up plan: 6 months   Mary-Margaret Romas, FNP  

## 2018-12-04 LAB — CMP14+EGFR
A/G RATIO: 2.4 — AB (ref 1.2–2.2)
ALBUMIN: 4.6 g/dL (ref 3.6–4.8)
ALT: 24 IU/L (ref 0–32)
AST: 32 IU/L (ref 0–40)
Alkaline Phosphatase: 70 IU/L (ref 39–117)
BILIRUBIN TOTAL: 0.4 mg/dL (ref 0.0–1.2)
BUN / CREAT RATIO: 18 (ref 12–28)
BUN: 16 mg/dL (ref 8–27)
CHLORIDE: 100 mmol/L (ref 96–106)
CO2: 26 mmol/L (ref 20–29)
Calcium: 9.5 mg/dL (ref 8.7–10.3)
Creatinine, Ser: 0.88 mg/dL (ref 0.57–1.00)
GFR calc non Af Amer: 72 mL/min/{1.73_m2} (ref >59)
GFR, EST AFRICAN AMERICAN: 83 mL/min/{1.73_m2} (ref >59)
Globulin, Total: 1.9 g/dL (ref 1.5–4.5)
Glucose: 96 mg/dL (ref 65–99)
POTASSIUM: 4.5 mmol/L (ref 3.5–5.2)
SODIUM: 138 mmol/L (ref 134–144)
TOTAL PROTEIN: 6.5 g/dL (ref 6.0–8.5)

## 2018-12-04 LAB — LIPID PANEL
Chol/HDL Ratio: 2.5 ratio (ref 0.0–4.4)
Cholesterol, Total: 199 mg/dL (ref 100–199)
HDL: 81 mg/dL (ref >39)
LDL Calculated: 105 mg/dL — ABNORMAL HIGH (ref 0–99)
Triglycerides: 64 mg/dL (ref 0–149)
VLDL CHOLESTEROL CAL: 13 mg/dL (ref 5–40)

## 2018-12-04 LAB — THYROID PANEL WITH TSH
Free Thyroxine Index: 2.6 (ref 1.2–4.9)
T3 UPTAKE RATIO: 27 % (ref 24–39)
T4, Total: 9.8 ug/dL (ref 4.5–12.0)
TSH: 1.73 u[IU]/mL (ref 0.450–4.500)

## 2019-01-26 DIAGNOSIS — K080 Exfoliation of teeth due to systemic causes: Secondary | ICD-10-CM | POA: Diagnosis not present

## 2019-06-06 ENCOUNTER — Ambulatory Visit: Payer: Federal, State, Local not specified - PPO | Admitting: Nurse Practitioner

## 2019-06-08 ENCOUNTER — Telehealth: Payer: Self-pay | Admitting: Nurse Practitioner

## 2019-06-08 DIAGNOSIS — E034 Atrophy of thyroid (acquired): Secondary | ICD-10-CM

## 2019-06-08 DIAGNOSIS — E782 Mixed hyperlipidemia: Secondary | ICD-10-CM

## 2019-06-09 ENCOUNTER — Other Ambulatory Visit: Payer: Self-pay

## 2019-06-09 DIAGNOSIS — E782 Mixed hyperlipidemia: Secondary | ICD-10-CM

## 2019-06-09 DIAGNOSIS — E034 Atrophy of thyroid (acquired): Secondary | ICD-10-CM

## 2019-06-09 NOTE — Telephone Encounter (Signed)
Lab orders put in computer.

## 2019-06-10 ENCOUNTER — Other Ambulatory Visit: Payer: Federal, State, Local not specified - PPO

## 2019-06-10 DIAGNOSIS — E034 Atrophy of thyroid (acquired): Secondary | ICD-10-CM

## 2019-06-10 DIAGNOSIS — E782 Mixed hyperlipidemia: Secondary | ICD-10-CM | POA: Diagnosis not present

## 2019-06-11 LAB — CMP14+EGFR
ALT: 21 IU/L (ref 0–32)
AST: 28 IU/L (ref 0–40)
Albumin/Globulin Ratio: 2.3 — ABNORMAL HIGH (ref 1.2–2.2)
Albumin: 4.5 g/dL (ref 3.8–4.9)
Alkaline Phosphatase: 63 IU/L (ref 39–117)
BUN/Creatinine Ratio: 17 (ref 12–28)
BUN: 17 mg/dL (ref 8–27)
Bilirubin Total: 0.3 mg/dL (ref 0.0–1.2)
CO2: 24 mmol/L (ref 20–29)
Calcium: 9.6 mg/dL (ref 8.7–10.3)
Chloride: 100 mmol/L (ref 96–106)
Creatinine, Ser: 0.98 mg/dL (ref 0.57–1.00)
GFR calc Af Amer: 73 mL/min/{1.73_m2} (ref >59)
GFR calc non Af Amer: 63 mL/min/{1.73_m2} (ref >59)
Globulin, Total: 2 g/dL (ref 1.5–4.5)
Glucose: 97 mg/dL (ref 65–99)
Potassium: 4.7 mmol/L (ref 3.5–5.2)
Sodium: 142 mmol/L (ref 134–144)
Total Protein: 6.5 g/dL (ref 6.0–8.5)

## 2019-06-11 LAB — THYROID PANEL WITH TSH
Free Thyroxine Index: 2.6 (ref 1.2–4.9)
T3 Uptake Ratio: 26 % (ref 24–39)
T4, Total: 9.9 ug/dL (ref 4.5–12.0)
TSH: 2.37 u[IU]/mL (ref 0.450–4.500)

## 2019-06-11 LAB — LIPID PANEL
Chol/HDL Ratio: 2.4 ratio (ref 0.0–4.4)
Cholesterol, Total: 191 mg/dL (ref 100–199)
HDL: 80 mg/dL (ref >39)
LDL Calculated: 101 mg/dL — ABNORMAL HIGH (ref 0–99)
Triglycerides: 48 mg/dL (ref 0–149)
VLDL Cholesterol Cal: 10 mg/dL (ref 5–40)

## 2019-06-13 ENCOUNTER — Other Ambulatory Visit: Payer: Self-pay

## 2019-06-14 ENCOUNTER — Ambulatory Visit: Payer: Federal, State, Local not specified - PPO | Admitting: Nurse Practitioner

## 2019-06-14 ENCOUNTER — Encounter: Payer: Self-pay | Admitting: Nurse Practitioner

## 2019-06-14 VITALS — BP 117/66 | HR 77 | Temp 98.7°F | Ht 69.0 in | Wt 184.2 lb

## 2019-06-14 DIAGNOSIS — E034 Atrophy of thyroid (acquired): Secondary | ICD-10-CM

## 2019-06-14 DIAGNOSIS — K621 Rectal polyp: Secondary | ICD-10-CM

## 2019-06-14 DIAGNOSIS — E782 Mixed hyperlipidemia: Secondary | ICD-10-CM

## 2019-06-14 DIAGNOSIS — Z6827 Body mass index (BMI) 27.0-27.9, adult: Secondary | ICD-10-CM | POA: Diagnosis not present

## 2019-06-14 DIAGNOSIS — K219 Gastro-esophageal reflux disease without esophagitis: Secondary | ICD-10-CM | POA: Diagnosis not present

## 2019-06-14 DIAGNOSIS — N3000 Acute cystitis without hematuria: Secondary | ICD-10-CM

## 2019-06-14 DIAGNOSIS — R309 Painful micturition, unspecified: Secondary | ICD-10-CM

## 2019-06-14 LAB — URINALYSIS, COMPLETE
Bilirubin, UA: NEGATIVE
Leukocytes,UA: NEGATIVE
Nitrite, UA: POSITIVE — AB
Specific Gravity, UA: 1.025 (ref 1.005–1.030)
Urobilinogen, Ur: 1 mg/dL (ref 0.2–1.0)
pH, UA: 5 (ref 5.0–7.5)

## 2019-06-14 LAB — MICROSCOPIC EXAMINATION: Renal Epithel, UA: NONE SEEN /hpf

## 2019-06-14 MED ORDER — CIPROFLOXACIN HCL 500 MG PO TABS: 500.0000 mg | ORAL_TABLET | Freq: Two times a day (BID) | ORAL | 0 refills | Status: DC

## 2019-06-14 MED ORDER — LEVOTHYROXINE SODIUM 125 MCG PO TABS: 125.0000 ug | ORAL_TABLET | Freq: Every day | ORAL | 12 refills | Status: DC

## 2019-06-14 MED ORDER — CHANTIX STARTING MONTH PAK 0.5 MG X 11 & 1 MG X 42 PO TABS: ORAL_TABLET | ORAL | 0 refills | Status: DC

## 2019-06-14 MED ORDER — VARENICLINE TARTRATE 1 MG PO TABS: 1.0000 mg | ORAL_TABLET | Freq: Two times a day (BID) | ORAL | 1 refills | Status: DC

## 2019-06-14 NOTE — Patient Instructions (Signed)
Acute Urinary Retention, Female  Acute urinary retention means that you cannot pee (urinate) at all, or that you pee too little and your bladder is not emptied completely. If it is not treated, it can lead to kidney damage or other serious problems. Follow these instructions at home:  Take over-the-counter and prescription medicines only as told by your doctor. Ask your doctor what medicines you should stay away from. Do not take any medicine unless your doctor says it is okay to do so.  If you were sent home with a tube that drains pee from the bladder (catheter), take care of it as told by your doctor.  Drink enough fluid to keep your pee clear or pale yellow.  If you were given an antibiotic, take it as told by your doctor. Do not stop taking the antibiotic even if you start to feel better.  Do not use any products that contain nicotine or tobacco, such as cigarettes and e-cigarettes. If you need help quitting, ask your doctor.  Watch for changes in your symptoms. Tell your doctor about them.  If told, keep track of any changes in your blood pressure at home. Tell your doctor about them.  Keep all follow-up visits as told by your doctor. This is important. Contact a doctor if:  You have spasms or you leak pee when you have spasms. Get help right away if:  You have chills or a fever.  You have blood in your pee.  You have a tube that drains the bladder and: ? The tube stops draining pee. ? The tube falls out. Summary  Acute urinary retention means that you cannot pee at all, or that you pee too little and your bladder is not emptied completely. If it is not treated, it can result in kidney damage or other serious problems.  If you were sent home with a tube that drains pee from the bladder, take care of it as told by your doctor.  Pay attention to any changes in your symptoms. Tell your doctor about them. This information is not intended to replace advice given to you by your  health care provider. Make sure you discuss any questions you have with your health care provider. Document Released: 05/05/2008 Document Revised: 10/30/2017 Document Reviewed: 12/19/2016 Elsevier Patient Education  2020 Elsevier Inc.  

## 2019-06-14 NOTE — Addendum Note (Signed)
Addended by: Chevis Pretty on: 06/14/2019 04:32 PM   Modules accepted: Orders

## 2019-06-14 NOTE — Progress Notes (Signed)
Subjective:    Patient ID: Melissa Gilbert, female    DOB: 03/26/1956, 63 y.o.   MRN: [id number]   Chief Complaint: medical management of chronic issues   HPI:  1. Gastroesophageal reflux disease without esophagitis Is doing well right now- using OTC meds when needed  2. Hypothyroidism due to acquired atrophy of thyroid No problems tat aware of  3. Mixed hyperlipidemia Watches diet and does little exercise. Stays very busy.  4. BMI 27.0-27.9,adult No recent weight changes    Outpatient Encounter Medications as of 06/14/2019  Medication Sig  . baclofen (LIORESAL) 10 MG tablet Take 1 tablet (10 mg total) by mouth 3 (three) times daily.  . Black Cohosh 40 MG CAPS Take 40 mg by mouth 3 (three) times daily.  . Cholecalciferol (VITAMIN D3) 2000 UNITS capsule Take 2,000 Units by mouth daily.  Marland Kitchen. conjugated estrogens (PREMARIN) vaginal cream Place 1 Applicatorful vaginally daily.  . diclofenac sodium (VOLTAREN) 1 % GEL Apply 4 g topically 4 (four) times daily.  . fexofenadine (ALLEGRA) 180 MG tablet Take 1 tablet (180 mg total) by mouth daily.  Marland Kitchen. levothyroxine (SYNTHROID, LEVOTHROID) 125 MCG tablet TAKE (1) TABLET DAILY WITH BREAKFAST.  . rosuvastatin (CRESTOR) 10 MG tablet Take 1 tablet (10 mg total) by mouth daily.    Past Surgical History:  Procedure Laterality Date  . HAMMER TOE SURGERY    . RIGHT EYE SURGERY    . TUBAL LIGATION      Family History  Problem Relation Age of Onset  . Alzheimer's disease Mother   . Hypothyroidism Mother   . Heart disease Father   . Cancer Father        prostate cancer  . Hyperlipidemia Father   . Hypothyroidism Sister     New complaints: Dysuria  And frequency jsut started last night. 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  HENT: Negative.   Eyes: Negative for  pain.  Respiratory: Negative for shortness of breath.   Cardiovascular: Negative for chest pain, palpitations and leg swelling.  Gastrointestinal: Negative for abdominal pain.  Endocrine: Negative for polydipsia.  Genitourinary: Negative.   Skin: Negative for rash.  Neurological: Negative for dizziness, weakness and headaches.  Hematological: Does not bruise/bleed easily.  Psychiatric/Behavioral: Negative.   All other systems reviewed and are negative.      Objective:   Physical Exam Vitals signs and nursing note reviewed.  Constitutional:      General: She is not in acute distress.    Appearance: Normal appearance. She is well-developed.  HENT:     Head: Normocephalic.     Nose: Nose normal.  Eyes:     Pupils: Pupils are equal, round, and reactive to light.  Neck:     Musculoskeletal: Normal range of motion and neck supple.     Vascular: No carotid bruit or JVD.  Cardiovascular:     Rate and Rhythm: Normal rate and regular rhythm.     Heart sounds: Normal heart sounds.  Pulmonary:     Effort: Pulmonary effort is normal. No respiratory distress.     Breath sounds: Normal breath sounds. No wheezing or rales.  Chest:     Chest wall: No tenderness.  Abdominal:     General: Bowel sounds are normal. There is no distension or abdominal bruit.     Palpations: Abdomen  is soft. There is no hepatomegaly, splenomegaly, mass or pulsatile mass.     Tenderness: There is no abdominal tenderness.  Musculoskeletal: Normal range of motion.  Lymphadenopathy:     Cervical: No cervical adenopathy.  Skin:    General: Skin is warm and dry.  Neurological:     Mental Status: She is alert and oriented to person, place, and time.     Deep Tendon Reflexes: Reflexes are normal and symmetric.  Psychiatric:        Behavior: Behavior normal.        Thought Content: Thought content normal.        Judgment: Judgment normal.    BP 117/66   Pulse 77   Temp 98.7 F (37.1 C) (Oral)   Ht 5\' 9"   (1.753 m)   Wt 184 lb 3.2 oz (83.6 kg)   BMI 27.20 kg/m         Assessment & Plan:  DEZIYAH ARVIN comes in today with chief complaint of pain passing urine and Medical Management of Chronic Issues   Diagnosis and orders addressed:  1. Gastroesophageal reflux disease without esophagitis Aid spicy foods Do not eat 2 hours prior to bedtime  2. Hypothyroidism due to acquired atrophy of thyroid - levothyroxine (SYNTHROID) 125 MCG tablet; Take 1 tablet (125 mcg total) by mouth daily before breakfast.  Dispense: 30 tablet; Refill: 12  3. Mixed hyperlipidemia Low fat diet  4. BMI 27.0-27.9,adult Discussed diet and exercise for person with BMI >25 Will recheck weight in 3-6 months  5. Pain passing urine - Urinalysis, Complete  6. Rectal polyp - Ambulatory referral to Gastroenterology  7. Acute cystitis without hematuria Take medication as prescribe Cotton underwear Take shower not bath Cranberry juice, yogurt Force fluids AZO over the counter X2 days Culture pending RTO prn  - ciprofloxacin (CIPRO) 500 MG tablet; Take 1 tablet (500 mg total) by mouth 2 (two) times daily.  Dispense: 10 tablet; Refill: 0   Labs pending Health Maintenance reviewed Diet and exercise encouraged  Follow up plan: 6 months    Mary-Margaret Hassell Done, FNP

## 2019-06-18 ENCOUNTER — Other Ambulatory Visit: Payer: Self-pay | Admitting: Nurse Practitioner

## 2019-06-18 DIAGNOSIS — E782 Mixed hyperlipidemia: Secondary | ICD-10-CM

## 2019-06-28 DIAGNOSIS — Z1231 Encounter for screening mammogram for malignant neoplasm of breast: Secondary | ICD-10-CM | POA: Diagnosis not present

## 2019-07-06 DIAGNOSIS — R6889 Other general symptoms and signs: Secondary | ICD-10-CM | POA: Diagnosis not present

## 2019-08-13 ENCOUNTER — Emergency Department (HOSPITAL_COMMUNITY): Payer: Federal, State, Local not specified - PPO

## 2019-08-13 ENCOUNTER — Other Ambulatory Visit: Payer: Self-pay

## 2019-08-13 ENCOUNTER — Encounter (HOSPITAL_COMMUNITY): Payer: Self-pay | Admitting: Emergency Medicine

## 2019-08-13 ENCOUNTER — Emergency Department (HOSPITAL_COMMUNITY)
Admission: EM | Admit: 2019-08-13 | Discharge: 2019-08-13 | Disposition: A | Discharge status: Home / Self Care | Payer: Federal, State, Local not specified - PPO | Attending: Emergency Medicine | Admitting: Emergency Medicine

## 2019-08-13 DIAGNOSIS — Y999 Unspecified external cause status: Secondary | ICD-10-CM | POA: Diagnosis not present

## 2019-08-13 DIAGNOSIS — W19XXXA Unspecified fall, initial encounter: Secondary | ICD-10-CM

## 2019-08-13 DIAGNOSIS — Z79899 Other long term (current) drug therapy: Secondary | ICD-10-CM | POA: Diagnosis not present

## 2019-08-13 DIAGNOSIS — S0990XA Unspecified injury of head, initial encounter: Secondary | ICD-10-CM | POA: Diagnosis not present

## 2019-08-13 DIAGNOSIS — S0101XA Laceration without foreign body of scalp, initial encounter: Secondary | ICD-10-CM | POA: Diagnosis not present

## 2019-08-13 DIAGNOSIS — E039 Hypothyroidism, unspecified: Secondary | ICD-10-CM | POA: Diagnosis not present

## 2019-08-13 DIAGNOSIS — Y929 Unspecified place or not applicable: Secondary | ICD-10-CM | POA: Insufficient documentation

## 2019-08-13 DIAGNOSIS — W11XXXA Fall on and from ladder, initial encounter: Secondary | ICD-10-CM | POA: Insufficient documentation

## 2019-08-13 DIAGNOSIS — Y939 Activity, unspecified: Secondary | ICD-10-CM | POA: Insufficient documentation

## 2019-08-13 DIAGNOSIS — Z87891 Personal history of nicotine dependence: Secondary | ICD-10-CM | POA: Diagnosis not present

## 2019-08-13 DIAGNOSIS — Z23 Encounter for immunization: Secondary | ICD-10-CM | POA: Diagnosis not present

## 2019-08-13 DIAGNOSIS — S199XXA Unspecified injury of neck, initial encounter: Secondary | ICD-10-CM | POA: Diagnosis not present

## 2019-08-13 MED ORDER — TETANUS-DIPHTH-ACELL PERTUSSIS 5-2.5-18.5 LF-MCG/0.5 IM SUSP
0.5000 mL | Freq: Once | INTRAMUSCULAR | Status: AC
  Administered 2019-08-13: 17:00:00 0.5 mL via INTRAMUSCULAR
  Filled 2019-08-13: qty 0.5

## 2019-08-13 NOTE — ED Provider Notes (Signed)
[facility] EMERGENCY DEPARTMENT Provider Note   CSN: [id number] Arrival date & time: 08/13/19  1228     History   Chief Complaint Chief Complaint  Patient presents with   Fall    HPI Melissa Gilbert is a 63 y.o. female.     Patient is a 26-year-old female with past medical history of hyperlipidemia, thyroid disease coming to the emergency department for laceration to the posterior scalp.  Patient reports that just prior to arrival she was standing on a ladder inside of her house when she fell backward and hit her head on a couch.  No loss of consciousness.  Reports she was initially feeling just fine but when she went to clean her hair off she realized that the laceration was bigger than she thought so she decided to come and get it checked out.  No nausea, no vomiting, not on anticoagulations.     Past Medical History:  Diagnosis Date   Allergy    Hyperlipidemia    Thyroid disease     Patient Active Problem List   Diagnosis Date Noted   BMI 27.0-27.9,adult 02/26/2016   Hypothyroidism 04/04/2013   Hyperlipidemia 04/04/2013   GERD (gastroesophageal reflux disease) 04/04/2013   Allergic rhinitis 04/04/2013    Past Surgical History:  Procedure Laterality Date   EYE SURGERY     HAMMER TOE SURGERY     RIGHT EYE SURGERY     TUBAL LIGATION       OB History   No obstetric history on file.      Home Medications    Prior to Admission medications   Medication Sig Start Date End Date Taking? Authorizing Provider  Black Cohosh 40 MG CAPS Take 40 mg by mouth 3 (three) times daily.   Yes [provider]  Cholecalciferol (VITAMIN D3) 2000 UNITS capsule Take 2,000 Units by mouth daily.   Yes [provider]  diclofenac sodium (VOLTAREN) 1 % GEL Apply 4 g topically 4 (four) times daily. Patient taking differently: Apply 4 g topically 4 (four) times daily as needed.  12/03/18  Yes Milburn, Mary-Margaret, FNP  fexofenadine (ALLEGRA) 180 MG tablet  Take 1 tablet (180 mg total) by mouth daily. 02/26/16  Yes Hassell Done, Mary-Margaret, FNP  levothyroxine (SYNTHROID) 125 MCG tablet Take 1 tablet (125 mcg total) by mouth daily before breakfast. 06/14/19  Yes Hassell Done, Mary-Margaret, FNP  rosuvastatin (CRESTOR) 10 MG tablet TAKE 1 TABLET DAILY 06/20/19  Yes Hassell Done, Mary-Margaret, FNP  varenicline (CHANTIX CONTINUING MONTH PAK) 1 MG tablet Take 1 tablet (1 mg total) by mouth 2 (two) times daily. 06/14/19  Yes Chevis Pretty, FNP    Family History Family History  Problem Relation Age of Onset   Alzheimer's disease Mother    Hypothyroidism Mother    Heart disease Father    Cancer Father        prostate cancer   Hyperlipidemia Father    Hypothyroidism Sister     Social History Social History   Tobacco Use   Smoking status: Former Smoker   Smokeless tobacco: Never Used  Substance Use Topics   Alcohol use: No   Drug use: No     Allergies   Patient has no known allergies.   Review of Systems Review of Systems  Constitutional: Negative for fever.  Eyes: Negative for visual disturbance.  Respiratory: Negative for cough and shortness of breath.   Cardiovascular: Negative for chest pain.  Gastrointestinal: Negative for abdominal pain, nausea and vomiting.  Musculoskeletal: Negative  for arthralgias and back pain.  Skin: Positive for wound. Negative for rash.  Allergic/Immunologic: Negative for immunocompromised state.  Neurological: Positive for headaches. Negative for dizziness, seizures, syncope, speech difficulty, weakness and light-headedness.  All other systems reviewed and are negative.    Physical Exam Updated Vital Signs BP 128/77    Pulse 65    Temp 97.6 F (36.4 C) (Oral)    Resp 17    Ht 5\' 9"  (1.753 m)    Wt 83.9 kg    SpO2 100%    BMI 27.32 kg/m   Physical Exam Vitals signs and nursing note reviewed.  Constitutional:      Appearance: Normal appearance.  HENT:     Head: Normocephalic. Laceration  present. No raccoon eyes, Battle's sign or contusion.     Jaw: There is normal jaw occlusion.      Nose: Nose normal.     Mouth/Throat:     Mouth: Mucous membranes are moist.  Eyes:     Conjunctiva/sclera: Conjunctivae normal.  Neck:     Musculoskeletal: Full passive range of motion without pain. No pain with movement, spinous process tenderness or muscular tenderness.  Pulmonary:     Effort: Pulmonary effort is normal.  Skin:    General: Skin is dry.  Neurological:     Mental Status: She is alert.  Psychiatric:        Mood and Affect: Mood normal.      ED Treatments / Results  Labs (all labs ordered are listed, but only abnormal results are displayed) Labs Reviewed - No data to display  EKG None  Radiology Ct Head Wo Contrast  Result Date: 08/13/2019 CLINICAL DATA:  Trauma, fall from ladder EXAM: CT HEAD WITHOUT CONTRAST CT CERVICAL SPINE WITHOUT CONTRAST TECHNIQUE: Multidetector CT imaging of the head and cervical spine was performed following the standard protocol without intravenous contrast. Multiplanar CT image reconstructions of the cervical spine were also generated. COMPARISON:  None. FINDINGS: CT HEAD FINDINGS Brain: No evidence of acute infarction, hemorrhage, hydrocephalus, extra-axial collection or mass lesion/mass effect. Vascular: No hyperdense vessel or unexpected calcification. Skull: Normal. Negative for fracture or focal lesion. Sinuses/Orbits: No acute finding. Other: None. CT CERVICAL SPINE FINDINGS Alignment: Normal. Skull base and vertebrae: No acute fracture. No primary bone lesion or focal pathologic process. Soft tissues and spinal canal: No prevertebral fluid or swelling. No visible canal hematoma. Disc levels: Minor multilevel degenerative disc disease with disc space narrowing, endplate sclerosis and endplate osteophytes. Slight posterior facet arthropathy. Degenerative changes of the C1-2 articulation. No subluxation or dislocation. Upper chest:  Negative. Other: None. IMPRESSION: Normal head CT without contrast.  No acute intracranial abnormality. Minor cervical degenerative change. No acute osseous finding, fracture or malalignment. Electronically Signed   By: Judie Petit.  Shick M.D.   On: 08/13/2019 14:23   Ct Cervical Spine Wo Contrast  Result Date: 08/13/2019 CLINICAL DATA:  Trauma, fall from ladder EXAM: CT HEAD WITHOUT CONTRAST CT CERVICAL SPINE WITHOUT CONTRAST TECHNIQUE: Multidetector CT imaging of the head and cervical spine was performed following the standard protocol without intravenous contrast. Multiplanar CT image reconstructions of the cervical spine were also generated. COMPARISON:  None. FINDINGS: CT HEAD FINDINGS Brain: No evidence of acute infarction, hemorrhage, hydrocephalus, extra-axial collection or mass lesion/mass effect. Vascular: No hyperdense vessel or unexpected calcification. Skull: Normal. Negative for fracture or focal lesion. Sinuses/Orbits: No acute finding. Other: None. CT CERVICAL SPINE FINDINGS Alignment: Normal. Skull base and vertebrae: No acute fracture. No primary bone lesion or  focal pathologic process. Soft tissues and spinal canal: No prevertebral fluid or swelling. No visible canal hematoma. Disc levels: Minor multilevel degenerative disc disease with disc space narrowing, endplate sclerosis and endplate osteophytes. Slight posterior facet arthropathy. Degenerative changes of the C1-2 articulation. No subluxation or dislocation. Upper chest: Negative. Other: None. IMPRESSION: Normal head CT without contrast.  No acute intracranial abnormality. Minor cervical degenerative change. No acute osseous finding, fracture or malalignment. Electronically Signed   By: Judie PetitM.  Shick M.D.   On: 08/13/2019 14:23    Procedures .Marland Kitchen.Laceration Repair  Date/Time: 08/13/2019 3:47 PM Performed by: Arlyn DunningMcLean, Jaylea Plourde A, PA-C Authorized by: Arlyn DunningMcLean, Lavren Lewan A, PA-C   Consent:    Consent obtained:  Verbal   Consent given by:  Patient   Risks  discussed:  Infection, pain and poor cosmetic result   Alternatives discussed:  No treatment and observation Anesthesia (see MAR for exact dosages):    Anesthesia method:  None Laceration details:    Location:  Scalp   Scalp location:  Crown   Length (cm):  3   Depth (mm):  1 Repair type:    Repair type:  Simple Pre-procedure details:    Preparation:  Patient was prepped and draped in usual sterile fashion Treatment:    Area cleansed with:  Soap and water   Amount of cleaning:  Standard Skin repair:    Repair method:  Staples   Number of staples:  6 Approximation:    Approximation:  Close Post-procedure details:    Dressing:  Open (no dressing)   Patient tolerance of procedure:  Tolerated well, no immediate complications   (including critical care time)  Medications Ordered in ED Medications  Tdap (BOOSTRIX) injection 0.5 mL (has no administration in time range)     Initial Impression / Assessment and Plan / ED Course  I have reviewed the triage vital signs and the nursing notes.  Pertinent labs & imaging results that were available during my care of the patient were reviewed by me and considered in my medical decision making (see chart for details).         Final Clinical Impressions(s) / ED Diagnoses   Final diagnoses:  None    ED Discharge Orders    None       Jeral PinchMcLean, Karinna Beadles A, PA-C 08/13/19 1547    Vanetta MuldersZackowski, Scott, MD 08/27/19 [phone]

## 2019-08-13 NOTE — ED Triage Notes (Signed)
Pt reports falling off ladder and hitting back of head. Pt has laceration to posterior head. Pt states she fell approx 6 ft and hit head on back of couch. Pt denies LOC. AOx4 at this time.

## 2019-08-13 NOTE — Discharge Instructions (Signed)
Thank you for allowing me to care for you today. Please return to the emergency department if you have new or worsening symptoms. Take your medications as instructed.  ° °

## 2019-08-15 DIAGNOSIS — K6389 Other specified diseases of intestine: Secondary | ICD-10-CM | POA: Diagnosis not present

## 2019-08-15 DIAGNOSIS — K5730 Diverticulosis of large intestine without perforation or abscess without bleeding: Secondary | ICD-10-CM | POA: Diagnosis not present

## 2019-08-15 DIAGNOSIS — Z8371 Family history of colonic polyps: Secondary | ICD-10-CM | POA: Diagnosis not present

## 2019-08-19 ENCOUNTER — Other Ambulatory Visit: Payer: Self-pay

## 2019-08-22 ENCOUNTER — Encounter: Payer: Self-pay | Admitting: Family

## 2019-08-22 ENCOUNTER — Ambulatory Visit: Payer: Federal, State, Local not specified - PPO | Admitting: Family

## 2019-08-22 ENCOUNTER — Other Ambulatory Visit: Payer: Self-pay

## 2019-08-22 VITALS — BP 105/62 | HR 57 | Temp 97.8°F | Resp 20 | Ht 69.0 in | Wt 186.6 lb

## 2019-08-22 DIAGNOSIS — Z5189 Encounter for other specified aftercare: Secondary | ICD-10-CM | POA: Diagnosis not present

## 2019-08-22 DIAGNOSIS — Z09 Encounter for follow-up examination after completed treatment for conditions other than malignant neoplasm: Secondary | ICD-10-CM | POA: Diagnosis not present

## 2019-08-22 DIAGNOSIS — Z4802 Encounter for removal of sutures: Secondary | ICD-10-CM

## 2019-08-22 DIAGNOSIS — S0180XA Unspecified open wound of other part of head, initial encounter: Secondary | ICD-10-CM

## 2019-08-22 NOTE — Progress Notes (Signed)
   Subjective:    Patient ID: Melissa Gilbert, female    DOB: 09-29-1956, 63 y.o.   MRN: [id number]  Chief Complaint  Patient presents with  . remove staples from scalp    HPI Pt presents to the office today for staple removal. She states she fell off a ladder and went to the ED on 08/13/19. She had 5 staples placed on 08/13/19. She denies any fever, discharge, redness, headaches.    Review of Systems  All other systems reviewed and are negative.      Objective:   Physical Exam Vitals signs reviewed.  Constitutional:      General: She is not in acute distress.    Appearance: She is well-developed.  HENT:     Head: Normocephalic and atraumatic.     Right Ear: Tympanic membrane normal.     Left Ear: Tympanic membrane normal.  Eyes:     Pupils: Pupils are equal, round, and reactive to light.  Neck:     Musculoskeletal: Normal range of motion and neck supple.     Thyroid: No thyromegaly.  Cardiovascular:     Rate and Rhythm: Normal rate and regular rhythm.     Heart sounds: Normal heart sounds. No murmur.  Pulmonary:     Effort: Pulmonary effort is normal. No respiratory distress.     Breath sounds: Normal breath sounds. No wheezing.  Abdominal:     General: Bowel sounds are normal. There is no distension.     Palpations: Abdomen is soft.     Tenderness: There is no abdominal tenderness.  Musculoskeletal: Normal range of motion.        General: No tenderness.  Skin:    General: Skin is warm and dry.          Comments: Well approimated laceration, 5 intact staples  Neurological:     Mental Status: She is alert and oriented to person, place, and time.     Cranial Nerves: No cranial nerve deficit.     Deep Tendon Reflexes: Reflexes are normal and symmetric.  Psychiatric:        Behavior: Behavior normal.        Thought Content: Thought content normal.        Judgment: Judgment normal.     5 staples removed  BP 105/62   Pulse (!) 57   Temp 97.8 F (36.6 C)  (Temporal)   Resp 20   Ht 5\' 9"  (1.753 m)   Wt 186 lb 9.6 oz (84.6 kg)   SpO2 97%   BMI 27.56 kg/m      Assessment & Plan:  Melissa Gilbert comes in today with chief complaint of remove staples from scalp   Diagnosis and orders addressed:  1. Encounter for staple removal  2. Hospital discharge follow-up  3. Visit for wound check  Report any s/s of infection  Looks great today Keep clean and dry Fall precautions discussed Hospital  notes reviewed    Evelina Dun, FNP

## 2019-08-22 NOTE — Patient Instructions (Signed)
Wound Closure Removal, Care After This sheet gives you information about how to care for yourself after your stitches (sutures), staples, or skin adhesives have been removed. Your health care provider may also give you more specific instructions. If you have problems or questions, contact your health care provider. What can I expect after the procedure? After your sutures or staples have been removed or your skin adhesives have fallen off, it is common to have:  Some discomfort and swelling in the area.  Slight redness in the area. Follow these instructions at home: If you have a bandage:  Wash your hands with soap and water before you change your bandage (dressing). If soap and water are not available, use hand sanitizer.  Change your dressing as told by your health care provider. If your dressing becomes wet or dirty, or develops a bad smell, change it as soon as possible.  If your dressing sticks to your skin, pour warm, clean water over it until it loosens and can be removed without pulling apart the wound edges. Pat the area dry with a soft, clean towel. Do not rub the wound because that may cause bleeding. Wound care   Keep the wound area dry and clean.  Check your wound every day for signs of infection. Check for: ? Redness, swelling, or pain. ? Fluid or blood. ? Warmth. ? Pus or a bad smell.  Wash your hands with soap and water before and after touching your wound.  Apply cream or ointment only as told by your health care provider. If you are using cream or ointment, wash the area with soap and water 2 times a day to remove all the cream or ointment. Rinse off the soap and pat the area dry with a clean towel.  If skin glue or adhesive strips were applied after sutures or staples were removed, leave these closures in place until they peel off on their own. If adhesive strip edges start to loosen and curl up, you may trim the loose edges. Do not remove adhesive strips completely  unless your health care provider tells you to do that.  Continue to protect the wound from injury.  Do not pick at your wound. Picking can cause an infection. Bathing  Do not take baths, swim, or use a hot tub until your health care provider approves.  Ask your health care provider when it is okay to shower.  Follow these steps for showering: ? If you have a dressing, remove it before getting into the shower. ? In the shower, allow soapy water to get on the wound. Avoid scrubbing the wound. ? When you get out of the shower, dry the wound by patting it with a clean towel. ? Reapply a dressing over the wound if needed. Scar care  When your wound has completely healed, take actions to help decrease the size of your scar: ? Wear sunscreen over the scar or cover it with clothing when you are outside. New scars get sunburned easily, which can make scarring worse. ? Gently massage the scarred area. This can decrease scar thickness. General instructions  Take over-the-counter and prescription medicines only as told by your health care provider.  Keep all follow-up visits as told by your health care provider. This is important. Contact a health care provider if:  You have redness, swelling, or pain around your wound.  You have fluid or blood coming from your wound.  Your wound feels warm to the touch.  You have pus   or a bad smell coming from your wound.  Your wound opens up.  You have chills. Get help right away if:  You have a fever.  You have redness that is spreading from your wound. Summary  Change your dressing as told by your health care provider. If your dressing becomes wet or dirty, or develops a bad smell, change it as soon as possible.  Check your wound every day for signs of infection.  Wash your hands with soap and water before and after touching your wound. This information is not intended to replace advice given to you by your health care provider. Make sure  you discuss any questions you have with your health care provider. Document Released: 10/30/2008 Document Revised: 10/30/2017 Document Reviewed: 09/07/2017 Elsevier Patient Education  2020 [employer].

## 2019-10-18 DIAGNOSIS — D225 Melanocytic nevi of trunk: Secondary | ICD-10-CM | POA: Diagnosis not present

## 2019-10-18 DIAGNOSIS — D0359 Melanoma in situ of other part of trunk: Secondary | ICD-10-CM | POA: Diagnosis not present

## 2019-10-18 DIAGNOSIS — L814 Other melanin hyperpigmentation: Secondary | ICD-10-CM | POA: Diagnosis not present

## 2019-10-18 DIAGNOSIS — L570 Actinic keratosis: Secondary | ICD-10-CM | POA: Diagnosis not present

## 2019-10-18 DIAGNOSIS — L821 Other seborrheic keratosis: Secondary | ICD-10-CM | POA: Diagnosis not present

## 2019-10-18 DIAGNOSIS — R238 Other skin changes: Secondary | ICD-10-CM | POA: Diagnosis not present

## 2019-10-18 DIAGNOSIS — D485 Neoplasm of uncertain behavior of skin: Secondary | ICD-10-CM | POA: Diagnosis not present

## 2019-11-18 DIAGNOSIS — D0359 Melanoma in situ of other part of trunk: Secondary | ICD-10-CM | POA: Diagnosis not present

## 2019-11-18 DIAGNOSIS — L905 Scar conditions and fibrosis of skin: Secondary | ICD-10-CM | POA: Diagnosis not present

## 2019-12-15 ENCOUNTER — Ambulatory Visit: Payer: Self-pay | Admitting: Nurse Practitioner

## 2019-12-19 ENCOUNTER — Other Ambulatory Visit: Payer: Self-pay

## 2019-12-20 ENCOUNTER — Encounter: Payer: Self-pay | Admitting: Nurse Practitioner

## 2019-12-20 ENCOUNTER — Ambulatory Visit: Payer: Federal, State, Local not specified - PPO | Admitting: Nurse Practitioner

## 2019-12-20 VITALS — BP 120/63 | HR 74 | Temp 97.8°F | Resp 20 | Ht 69.0 in | Wt 198.0 lb

## 2019-12-20 DIAGNOSIS — K219 Gastro-esophageal reflux disease without esophagitis: Secondary | ICD-10-CM

## 2019-12-20 DIAGNOSIS — E034 Atrophy of thyroid (acquired): Secondary | ICD-10-CM

## 2019-12-20 DIAGNOSIS — J301 Allergic rhinitis due to pollen: Secondary | ICD-10-CM

## 2019-12-20 DIAGNOSIS — E782 Mixed hyperlipidemia: Secondary | ICD-10-CM | POA: Diagnosis not present

## 2019-12-20 DIAGNOSIS — Z6827 Body mass index (BMI) 27.0-27.9, adult: Secondary | ICD-10-CM

## 2019-12-20 MED ORDER — LEVOTHYROXINE SODIUM 125 MCG PO TABS: 125.0000 ug | ORAL_TABLET | Freq: Every day | ORAL | 1 refills | Status: DC

## 2019-12-20 MED ORDER — ROSUVASTATIN CALCIUM 10 MG PO TABS: 10.0000 mg | ORAL_TABLET | Freq: Every day | ORAL | 1 refills | Status: DC

## 2019-12-20 MED ORDER — LEVOCETIRIZINE DIHYDROCHLORIDE 5 MG PO TABS: 5.0000 mg | ORAL_TABLET | Freq: Every evening | ORAL | 5 refills | Status: DC

## 2019-12-20 NOTE — Progress Notes (Signed)
Subjective:    Patient ID: Melissa Gilbert, female    DOB: 01-02-1956, 64 y.o.   MRN: [id number]   Chief Complaint: medical management of chronic issues   HPI:  1. Mixed hyperlipidemia No c/o chest pain, sob or headache. Does not check blood pressure at home. BP Readings from Last 3 Encounters:  08/22/19 105/62  08/13/19 106/61  06/14/19 117/66     2. Hypothyroidism due to acquired atrophy of thyroid No problems that she is aware of. Lab Results  Component Value Date   TSH 2.370 06/10/2019     3. Gastroesophageal reflux disease without esophagitis Has been doing well. No recent symptoms  4. Seasonal allergic rhinitis due to pollen xyzal seems to work the best for her- she needs rx sent in for this.  5. BMI 27.0-27.9,adult Has gained some weight recently because she has quit smoking.    Outpatient Encounter Medications as of 12/20/2019  Medication Sig  . Black Cohosh 40 MG CAPS Take 40 mg by mouth 3 (three) times daily.  . Cholecalciferol (VITAMIN D3) 2000 UNITS capsule Take 2,000 Units by mouth daily.  . diclofenac sodium (VOLTAREN) 1 % GEL Apply 4 g topically 4 (four) times daily. (Patient taking differently: Apply 4 g topically 4 (four) times daily as needed. )  . fexofenadine (ALLEGRA) 180 MG tablet Take 1 tablet (180 mg total) by mouth daily.  Marland Kitchen levothyroxine (SYNTHROID) 125 MCG tablet Take 1 tablet (125 mcg total) by mouth daily before breakfast.  . rosuvastatin (CRESTOR) 10 MG tablet TAKE 1 TABLET DAILY  . varenicline (CHANTIX CONTINUING MONTH PAK) 1 MG tablet Take 1 tablet (1 mg total) by mouth 2 (two) times daily.    Past Surgical History:  Procedure Laterality Date  . EYE SURGERY    . HAMMER TOE SURGERY    . RIGHT EYE SURGERY    . TUBAL LIGATION      Family History  Problem Relation Age of Onset  . Alzheimer's disease Mother   . Hypothyroidism Mother   . Heart disease Father   . Cancer Father        prostate cancer  . Hyperlipidemia Father   .  Hypothyroidism Sister     New complaints: None today  Social history: Lives with husband  Controlled substance contract: n/a    Review of Systems  Constitutional: Negative for diaphoresis.  Eyes: Negative for pain.  Respiratory: Negative for shortness of breath.   Cardiovascular: Negative for chest pain, palpitations and leg swelling.  Gastrointestinal: Negative for abdominal pain.  Endocrine: Negative for polydipsia.  Skin: Negative for rash.  Neurological: Negative for dizziness, weakness and headaches.  Hematological: Does not bruise/bleed easily.  All other systems reviewed and are negative.      Objective:   Physical Exam Vitals and nursing note reviewed.  Constitutional:      General: She is not in acute distress.    Appearance: Normal appearance. She is well-developed.  HENT:     Head: Normocephalic.     Nose: Nose normal.  Eyes:     Pupils: Pupils are equal, round, and reactive to light.  Neck:     Vascular: No carotid bruit or JVD.  Cardiovascular:     Rate and Rhythm: Normal rate and regular rhythm.     Heart sounds: Normal heart sounds.  Pulmonary:     Effort: Pulmonary effort is normal. No respiratory distress.     Breath sounds: Normal breath sounds. No wheezing or rales.  Chest:  Chest wall: No tenderness.  Abdominal:     General: Bowel sounds are normal. There is no distension or abdominal bruit.     Palpations: Abdomen is soft. There is no hepatomegaly, splenomegaly, mass or pulsatile mass.     Tenderness: There is no abdominal tenderness.  Musculoskeletal:        General: Normal range of motion.     Cervical back: Normal range of motion and neck supple.  Lymphadenopathy:     Cervical: No cervical adenopathy.  Skin:    General: Skin is warm and dry.  Neurological:     Mental Status: She is alert and oriented to person, place, and time.     Deep Tendon Reflexes: Reflexes are normal and symmetric.  Psychiatric:        Behavior: Behavior  normal.        Thought Content: Thought content normal.        Judgment: Judgment normal.     BP 120/63   Pulse 74   Temp 97.8 F (36.6 C) (Temporal)   Resp 20   Ht 5\' 9"  (1.753 m)   Wt 198 lb (89.8 kg)   SpO2 96%   BMI 29.24 kg/m       Assessment & Plan:  Melissa Gilbert comes in today with chief complaint of Medical Management of Chronic Issues   Diagnosis and orders addressed:  1. Mixed hyperlipidemia Low sodium diet - rosuvastatin (CRESTOR) 10 MG tablet; Take 1 tablet (10 mg total) by mouth daily.  Dispense: 90 tablet; Refill: 1 - Lipid panel  2. Hypothyroidism due to acquired atrophy of thyroid - levothyroxine (SYNTHROID) 125 MCG tablet; Take 1 tablet (125 mcg total) by mouth daily before breakfast.  Dispense: 90 tablet; Refill: 1  3. Gastroesophageal reflux disease without esophagitis Avoid spicy foods Do not eat 2 hours prior to bedtime  4. Seasonal allergic rhinitis due to pollen - levocetirizine (XYZAL) 5 MG tablet; Take 1 tablet (5 mg total) by mouth every evening.  Dispense: 30 tablet; Refill: 5  5. BMI 27.0-27.9,adult Discussed diet and exercise for person with BMI >25 Will recheck weight in 3-6 months    Labs pending Health Maintenance reviewed Diet and exercise encouraged  Follow up plan: 6 months   Mary-Margaret Hassell Done, FNP

## 2019-12-21 LAB — LIPID PANEL
Chol/HDL Ratio: 2.4 ratio (ref 0.0–4.4)
Cholesterol, Total: 196 mg/dL (ref 100–199)
HDL: 81 mg/dL (ref >39)
LDL Chol Calc (NIH): 94 mg/dL (ref 0–99)
Triglycerides: 120 mg/dL (ref 0–149)
VLDL Cholesterol Cal: 21 mg/dL (ref 5–40)

## 2020-02-06 ENCOUNTER — Ambulatory Visit (INDEPENDENT_AMBULATORY_CARE_PROVIDER_SITE_OTHER): Payer: Federal, State, Local not specified - PPO | Admitting: Nurse Practitioner

## 2020-02-06 ENCOUNTER — Encounter: Payer: Self-pay | Admitting: Nurse Practitioner

## 2020-02-06 DIAGNOSIS — H9312 Tinnitus, left ear: Secondary | ICD-10-CM | POA: Diagnosis not present

## 2020-02-06 DIAGNOSIS — R42 Dizziness and giddiness: Secondary | ICD-10-CM | POA: Diagnosis not present

## 2020-02-06 MED ORDER — MECLIZINE HCL 25 MG PO TABS: 25.0000 mg | ORAL_TABLET | Freq: Three times a day (TID) | ORAL | 0 refills | Status: DC | PRN

## 2020-02-06 MED ORDER — FLUTICASONE PROPIONATE 50 MCG/ACT NA SUSP: 2.0000 | Freq: Every day | NASAL | 6 refills | Status: DC

## 2020-02-06 NOTE — Progress Notes (Signed)
   Virtual Visit via telephone Note Due to COVID-19 pandemic this visit was conducted virtually. This visit type was conducted due to national recommendations for restrictions regarding the COVID-19 Pandemic (e.g. social distancing, sheltering in place) in an effort to limit this patient's exposure and mitigate transmission in our community. All issues noted in this document were discussed and addressed.  A physical exam was not performed with this format.  I connected with Melissa Gilbert on 02/06/20 at 3:25 by telephone and verified that I am speaking with the correct person using two identifiers. Melissa Gilbert is currently located at home and no one is currently with her during visit. The provider, Mary-Margaret Daphine Deutscher, FNP is located in their office at time of visit.  I discussed the limitations, risks, security and privacy concerns of performing an evaluation and management service by telephone and the availability of in person appointments. I also discussed with the patient that there may be a patient responsible charge related to this service. The patient expressed understanding and agreed to proceed.   History and Present Illness:   Chief Complaint: URI   HPI Patient calls in c/o ringing in left ear. Started about 1 week ago. She has been taking her allergy meds. She denies over use of tylenol. Occasional dizziness.     Review of Systems  Constitutional: Negative for chills and fever.  HENT: Positive for hearing loss (mild on left) and tinnitus (left ear).   Respiratory: Negative.   Cardiovascular: Negative.   Genitourinary: Negative.   Neurological: Positive for dizziness.  Psychiatric/Behavioral: Negative.   All other systems reviewed and are negative.    Observations/Objective: Alert and oriented- answers all questions appropriately No distress    Assessment and Plan: Melissa Gilbert in today with chief complaint of URI   1. Tinnitus of left ear Force fluids rest  - fluticasone (FLONASE) 50 MCG/ACT nasal spray; Place 2 sprays into both nostrils daily.  Dispense: 16 g; Refill: 6  2. Vertigo Fall precautions. - meclizine (ANTIVERT) 25 MG tablet; Take 1 tablet (25 mg total) by mouth 3 (three) times daily as needed for dizziness.  Dispense: 30 tablet; Refill: 0     Follow Up Instructions: prn    I discussed the assessment and treatment plan with the patient. The patient was provided an opportunity to ask questions and all were answered. The patient agreed with the plan and demonstrated an understanding of the instructions.   The patient was advised to call back or seek an in-person evaluation if the symptoms worsen or if the condition fails to improve as anticipated.  The above assessment and management plan was discussed with the patient. The patient verbalized understanding of and has agreed to the management plan. Patient is aware to call the clinic if symptoms persist or worsen. Patient is aware when to return to the clinic for a follow-up visit. Patient educated on when it is appropriate to go to the emergency department.   Time call ended:  3:37 I provided 17 minutes of non-face-to-face time during this encounter.    Mary-Margaret Daphine Deutscher, FNP

## 2020-02-09 ENCOUNTER — Telehealth: Payer: Self-pay | Admitting: Nurse Practitioner

## 2020-02-09 DIAGNOSIS — H9312 Tinnitus, left ear: Secondary | ICD-10-CM

## 2020-02-09 NOTE — Telephone Encounter (Signed)
Pt wanted to let MMM know that she still has ringing in ear. She had televisit with her 3/8/2021and the rx did not work and her condition is worse. Please call back.

## 2020-02-10 MED ORDER — PREMARIN 0.625 MG/GM VA CREA: 1.0000 | TOPICAL_CREAM | Freq: Every day | VAGINAL | 12 refills | Status: DC

## 2020-02-10 NOTE — Addendum Note (Signed)
Addended by: Bennie Pierini on: 02/10/2020 04:18 PM   Modules accepted: Orders

## 2020-02-10 NOTE — Telephone Encounter (Signed)
Please do audiology referral

## 2020-02-10 NOTE — Telephone Encounter (Signed)
Referral - patient aware.  Patient states she would like a rx for premarin.

## 2020-02-16 DIAGNOSIS — Z23 Encounter for immunization: Secondary | ICD-10-CM | POA: Diagnosis not present

## 2020-03-05 DIAGNOSIS — L570 Actinic keratosis: Secondary | ICD-10-CM | POA: Diagnosis not present

## 2020-03-05 DIAGNOSIS — D1801 Hemangioma of skin and subcutaneous tissue: Secondary | ICD-10-CM | POA: Diagnosis not present

## 2020-03-05 DIAGNOSIS — D225 Melanocytic nevi of trunk: Secondary | ICD-10-CM | POA: Diagnosis not present

## 2020-03-05 DIAGNOSIS — L821 Other seborrheic keratosis: Secondary | ICD-10-CM | POA: Diagnosis not present

## 2020-03-05 DIAGNOSIS — L905 Scar conditions and fibrosis of skin: Secondary | ICD-10-CM | POA: Diagnosis not present

## 2020-03-07 DIAGNOSIS — Z8669 Personal history of other diseases of the nervous system and sense organs: Secondary | ICD-10-CM | POA: Diagnosis not present

## 2020-03-07 DIAGNOSIS — R1110 Vomiting, unspecified: Secondary | ICD-10-CM | POA: Diagnosis not present

## 2020-03-07 DIAGNOSIS — H6982 Other specified disorders of Eustachian tube, left ear: Secondary | ICD-10-CM | POA: Diagnosis not present

## 2020-03-07 DIAGNOSIS — R42 Dizziness and giddiness: Secondary | ICD-10-CM | POA: Diagnosis not present

## 2020-03-16 DIAGNOSIS — H903 Sensorineural hearing loss, bilateral: Secondary | ICD-10-CM | POA: Diagnosis not present

## 2020-03-16 DIAGNOSIS — H9312 Tinnitus, left ear: Secondary | ICD-10-CM | POA: Diagnosis not present

## 2020-03-16 DIAGNOSIS — Z23 Encounter for immunization: Secondary | ICD-10-CM | POA: Diagnosis not present

## 2020-03-16 DIAGNOSIS — H905 Unspecified sensorineural hearing loss: Secondary | ICD-10-CM | POA: Diagnosis not present

## 2020-04-11 DIAGNOSIS — R300 Dysuria: Secondary | ICD-10-CM | POA: Diagnosis not present

## 2020-04-11 DIAGNOSIS — Z6825 Body mass index (BMI) 25.0-25.9, adult: Secondary | ICD-10-CM | POA: Diagnosis not present

## 2020-04-24 ENCOUNTER — Other Ambulatory Visit: Payer: Self-pay | Admitting: Nurse Practitioner

## 2020-04-24 DIAGNOSIS — R42 Dizziness and giddiness: Secondary | ICD-10-CM

## 2020-06-14 ENCOUNTER — Other Ambulatory Visit: Payer: Self-pay | Admitting: Nurse Practitioner

## 2020-06-14 DIAGNOSIS — Z1231 Encounter for screening mammogram for malignant neoplasm of breast: Secondary | ICD-10-CM

## 2020-06-21 ENCOUNTER — Other Ambulatory Visit: Payer: Self-pay

## 2020-06-21 ENCOUNTER — Ambulatory Visit: Payer: Federal, State, Local not specified - PPO | Admitting: Nurse Practitioner

## 2020-06-21 ENCOUNTER — Encounter: Payer: Self-pay | Admitting: Nurse Practitioner

## 2020-06-21 VITALS — BP 96/57 | HR 68 | Temp 97.8°F | Ht 69.0 in | Wt 167.0 lb

## 2020-06-21 DIAGNOSIS — E782 Mixed hyperlipidemia: Secondary | ICD-10-CM

## 2020-06-21 DIAGNOSIS — K219 Gastro-esophageal reflux disease without esophagitis: Secondary | ICD-10-CM | POA: Diagnosis not present

## 2020-06-21 DIAGNOSIS — Z6827 Body mass index (BMI) 27.0-27.9, adult: Secondary | ICD-10-CM

## 2020-06-21 DIAGNOSIS — F17200 Nicotine dependence, unspecified, uncomplicated: Secondary | ICD-10-CM

## 2020-06-21 DIAGNOSIS — E034 Atrophy of thyroid (acquired): Secondary | ICD-10-CM

## 2020-06-21 MED ORDER — VARENICLINE TARTRATE 1 MG PO TABS: 1.0000 mg | ORAL_TABLET | Freq: Two times a day (BID) | ORAL | 2 refills | Status: DC

## 2020-06-21 MED ORDER — ROSUVASTATIN CALCIUM 10 MG PO TABS: 10.0000 mg | ORAL_TABLET | Freq: Every day | ORAL | 1 refills | Status: DC

## 2020-06-21 MED ORDER — CHANTIX STARTING MONTH PAK 0.5 MG X 11 & 1 MG X 42 PO TABS: ORAL_TABLET | ORAL | 0 refills | Status: DC

## 2020-06-21 MED ORDER — LEVOTHYROXINE SODIUM 125 MCG PO TABS: 125.0000 ug | ORAL_TABLET | Freq: Every day | ORAL | 1 refills | Status: DC

## 2020-06-21 NOTE — Patient Instructions (Signed)
Steps to Quit Smoking Smoking tobacco is the leading cause of preventable death. It can affect almost every organ in the body. Smoking puts you and people around you at risk for many serious, long-lasting (chronic) diseases. Quitting smoking can be hard, but it is one of the best things that you can do for your health. It is never too late to quit. How do I get ready to quit? When you decide to quit smoking, make a plan to help you succeed. Before you quit:  Pick a date to quit. Set a date within the next 2 weeks to give you time to prepare.  Write down the reasons why you are quitting. Keep this list in places where you will see it often.  Tell your family, friends, and co-workers that you are quitting. Their support is important.  Talk with your doctor about the choices that may help you quit.  Find out if your health insurance will pay for these treatments.  Know the people, places, things, and activities that make you want to smoke (triggers). Avoid them. What first steps can I take to quit smoking?  Throw away all cigarettes at home, at work, and in your car.  Throw away the things that you use when you smoke, such as ashtrays and lighters.  Clean your car. Make sure to empty the ashtray.  Clean your home, including curtains and carpets. What can I do to help me quit smoking? Talk with your doctor about taking medicines and seeing a counselor at the same time. You are more likely to succeed when you do both.  If you are pregnant or breastfeeding, talk with your doctor about counseling or other ways to quit smoking. Do not take medicine to help you quit smoking unless your doctor tells you to do so. To quit smoking: Quit right away  Quit smoking totally, instead of slowly cutting back on how much you smoke over a period of time.  Go to counseling. You are more likely to quit if you go to counseling sessions regularly. Take medicine You may take medicines to help you quit. Some  medicines need a prescription, and some you can buy over-the-counter. Some medicines may contain a drug called nicotine to replace the nicotine in cigarettes. Medicines may:  Help you to stop having the desire to smoke (cravings).  Help to stop the problems that come when you stop smoking (withdrawal symptoms). Your doctor may ask you to use:  Nicotine patches, gum, or lozenges.  Nicotine inhalers or sprays.  Non-nicotine medicine that is taken by mouth. Find resources Find resources and other ways to help you quit smoking and remain smoke-free after you quit. These resources are most helpful when you use them often. They include:  Online chats with a counselor.  Phone quitlines.  Printed self-help materials.  Support groups or group counseling.  Text messaging programs.  Mobile phone apps. Use apps on your mobile phone or tablet that can help you stick to your quit plan. There are many free apps for mobile phones and tablets as well as websites. Examples include Quit Guide from the CDC and smokefree.gov  What things can I do to make it easier to quit?   Talk to your family and friends. Ask them to support and encourage you.  Call a phone quitline (1-800-QUIT-NOW), reach out to support groups, or work with a counselor.  Ask people who smoke to not smoke around you.  Avoid places that make you want to smoke,   such as: ? Bars. ? Parties. ? Smoke-break areas at work.  Spend time with people who do not smoke.  Lower the stress in your life. Stress can make you want to smoke. Try these things to help your stress: ? Getting regular exercise. ? Doing deep-breathing exercises. ? Doing yoga. ? Meditating. ? Doing a body scan. To do this, close your eyes, focus on one area of your body at a time from head to toe. Notice which parts of your body are tense. Try to relax the muscles in those areas. How will I feel when I quit smoking? Day 1 to 3 weeks Within the first 24 hours,  you may start to have some problems that come from quitting tobacco. These problems are very bad 2-3 days after you quit, but they do not often last for more than 2-3 weeks. You may get these symptoms:  Mood swings.  Feeling restless, nervous, angry, or annoyed.  Trouble concentrating.  Dizziness.  Strong desire for high-sugar foods and nicotine.  Weight gain.  Trouble pooping (constipation).  Feeling like you may vomit (nausea).  Coughing or a sore throat.  Changes in how the medicines that you take for other issues work in your body.  Depression.  Trouble sleeping (insomnia). Week 3 and afterward After the first 2-3 weeks of quitting, you may start to notice more positive results, such as:  Better sense of smell and taste.  Less coughing and sore throat.  Slower heart rate.  Lower blood pressure.  Clearer skin.  Better breathing.  Fewer sick days. Quitting smoking can be hard. Do not give up if you fail the first time. Some people need to try a few times before they succeed. Do your best to stick to your quit plan, and talk with your doctor if you have any questions or concerns. Summary  Smoking tobacco is the leading cause of preventable death. Quitting smoking can be hard, but it is one of the best things that you can do for your health.  When you decide to quit smoking, make a plan to help you succeed.  Quit smoking right away, not slowly over a period of time.  When you start quitting, seek help from your doctor, family, or friends. This information is not intended to replace advice given to you by your health care provider. Make sure you discuss any questions you have with your health care provider. Document Revised: 08/12/2019 Document Reviewed: 02/05/2019 Elsevier Patient Education  2020 [employer]  

## 2020-06-21 NOTE — Progress Notes (Signed)
Subjective:    Patient ID: Melissa Gilbert, female    DOB: 05/30/1956, 64 y.o.   MRN: [id number]   Chief Complaint: Medical Management of Chronic Issues    HPI:  1. Gastroesophageal reflux disease without esophagitis Is currently on no medication  2. Mixed hyperlipidemia Tries to watch diet and does no exercise. 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 Lab Results  Component Value Date   CHOL 196 12/20/2019   HDL 81 12/20/2019   LDLCALC 94 12/20/2019   TRIG 120 12/20/2019   CHOLHDL 2.4 12/20/2019     3. Hypothyroidism due to acquired atrophy of thyroid Is doing well without any problems. Lab Results  Component Value Date   TSH 2.370 06/10/2019     4. BMI 27.0-27.9,adult No recent weight changes  Wt Readings from Last 3 Encounters:  12/20/19 198 lb (89.8 kg)  08/22/19 186 lb 9.6 oz (84.6 kg)  08/13/19 185 lb (83.9 kg)   BMI Readings from Last 3 Encounters:  12/20/19 29.24 kg/m  08/22/19 27.56 kg/m  08/13/19 27.32 kg/m       Outpatient Encounter Medications as of 06/21/2020  Medication Sig  . Black Cohosh 40 MG CAPS Take 40 mg by mouth 3 (three) times daily.  . Cholecalciferol (VITAMIN D3) 2000 UNITS capsule Take 2,000 Units by mouth daily.  Marland Kitchen conjugated estrogens (PREMARIN) vaginal cream Place 1 Applicatorful vaginally daily.  . diclofenac sodium (VOLTAREN) 1 % GEL Apply 4 g topically 4 (four) times daily. (Patient taking differently: Apply 4 g topically 4 (four) times daily as needed. )  . fluticasone (FLONASE) 50 MCG/ACT nasal spray Place 2 sprays into both nostrils daily.  Marland Kitchen levocetirizine (XYZAL) 5 MG tablet Take 1 tablet (5 mg total) by mouth every evening.  Marland Kitchen levothyroxine (SYNTHROID) 125 MCG tablet Take 1 tablet (125 mcg total) by mouth daily before breakfast.  . meclizine (ANTIVERT) 25 MG tablet TAKE (1) TABLET THREE TIMES DAILY AS NEEDED FOR DIZZINESS.  . rosuvastatin (CRESTOR) 10 MG tablet Take 1 tablet (10 mg total) by mouth daily.      Past Surgical History:  Procedure Laterality Date  . EYE SURGERY    . HAMMER TOE SURGERY    . RIGHT EYE SURGERY    . TUBAL LIGATION      Family History  Problem Relation Age of Onset  . Alzheimer's disease Mother   . Hypothyroidism Mother   . Heart disease Father   . Cancer Father        prostate cancer  . Hyperlipidemia Father   . 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  HENT: Negative.   Eyes: Negative for pain.  Respiratory: Negative for shortness of breath.   Cardiovascular: Negative for chest pain, palpitations and leg swelling.  Gastrointestinal: Negative for abdominal pain.  Endocrine: Negative for polydipsia.  Skin: Negative for rash.  Neurological: Negative for dizziness, weakness and headaches.  Hematological: Does not bruise/bleed easily.  All other systems reviewed and are negative.      Objective:   Physical Exam Vitals and nursing note reviewed.  Constitutional:      General: She is not in acute distress.    Appearance: Normal appearance. She is well-developed.  HENT:     Head: Normocephalic.     Nose: Nose normal.  Eyes:  Pupils: Pupils are equal, round, and reactive to light.  Neck:     Vascular: No carotid bruit or JVD.  Cardiovascular:     Rate and Rhythm: Normal rate and regular rhythm.     Heart sounds: Normal heart sounds.  Pulmonary:     Effort: Pulmonary effort is normal. No respiratory distress.     Breath sounds: Normal breath sounds. No wheezing or rales.  Chest:     Chest wall: No tenderness.  Abdominal:     General: Bowel sounds are normal. There is no distension or abdominal bruit.     Palpations: Abdomen is soft. There is no hepatomegaly, splenomegaly, mass or pulsatile mass.     Tenderness: There is no abdominal tenderness.  Musculoskeletal:         General: Normal range of motion.     Cervical back: Normal range of motion and neck supple.  Lymphadenopathy:     Cervical: No cervical adenopathy.  Skin:    General: Skin is warm and dry.  Neurological:     Mental Status: She is alert and oriented to person, place, and time.     Deep Tendon Reflexes: Reflexes are normal and symmetric.  Psychiatric:        Behavior: Behavior normal.        Thought Content: Thought content normal.        Judgment: Judgment normal.    BP (!) 96/57   Pulse 68   Temp 97.8 F (36.6 C) (Temporal)   Ht 5' 9"  (1.753 m)   Wt 167 lb (75.8 kg)   BMI 24.66 kg/m         Assessment & Plan:  Melissa Gilbert comes in today with chief complaint of Medical Management of Chronic Issues   Diagnosis and orders addressed:  1. Gastroesophageal reflux disease without esophagitis Avoid spicy foods Do not eat 2 hours prior to bedtime   2. Mixed hyperlipidemia Low fat diet - CMP14+EGFR - CBC with Differential/Platelet - Lipid panel - rosuvastatin (CRESTOR) 10 MG tablet; Take 1 tablet (10 mg total) by mouth daily.  Dispense: 90 tablet; Refill: 1  3. Hypothyroidism due to acquired atrophy of thyroid Labs pedning - Thyroid Panel With TSH - levothyroxine (SYNTHROID) 125 MCG tablet; Take 1 tablet (125 mcg total) by mouth daily before breakfast.  Dispense: 90 tablet; Refill: 1  4. BMI 27.0-27.9,adult Discussed diet and exercise for person with BMI >25 Will recheck weight in 3-6 months  5. Smoking Smoking cessation infromation given - varenicline (CHANTIX STARTING MONTH PAK) 0.5 MG X 11 & 1 MG X 42 tablet; Take one 0.5 mg tablet by mouth once daily for 3 days, then increase to one 0.5 mg tablet twice daily for 4 days, then increase to one 1 mg tablet twice daily.  Dispense: 53 tablet; Refill: 0 - varenicline (CHANTIX CONTINUING MONTH PAK) 1 MG tablet; Take 1 tablet (1 mg total) by mouth 2 (two) times daily.  Dispense: 60 tablet; Refill: 2   Labs  pending Health Maintenance reviewed Diet and exercise encouraged  Follow up plan: 6 months   Mary-Margaret Hassell Done, FNP

## 2020-06-22 LAB — CMP14+EGFR
ALT: 13 IU/L (ref 0–32)
AST: 24 IU/L (ref 0–40)
Albumin/Globulin Ratio: 3 — ABNORMAL HIGH (ref 1.2–2.2)
Albumin: 4.8 g/dL (ref 3.8–4.8)
Alkaline Phosphatase: 75 IU/L (ref 48–121)
BUN/Creatinine Ratio: 19 (ref 12–28)
BUN: 14 mg/dL (ref 8–27)
Bilirubin Total: <0.2 mg/dL (ref 0.0–1.2)
CO2: 28 mmol/L (ref 20–29)
Calcium: 9.3 mg/dL (ref 8.7–10.3)
Chloride: 101 mmol/L (ref 96–106)
Creatinine, Ser: 0.75 mg/dL (ref 0.57–1.00)
GFR calc Af Amer: 99 mL/min/{1.73_m2} (ref >59)
GFR calc non Af Amer: 86 mL/min/{1.73_m2} (ref >59)
Globulin, Total: 1.6 g/dL (ref 1.5–4.5)
Glucose: 92 mg/dL (ref 65–99)
Potassium: 4.4 mmol/L (ref 3.5–5.2)
Sodium: 139 mmol/L (ref 134–144)
Total Protein: 6.4 g/dL (ref 6.0–8.5)

## 2020-06-22 LAB — CBC WITH DIFFERENTIAL/PLATELET
Basophils Absolute: 0.1 10*3/uL (ref 0.0–0.2)
Basos: 1 %
EOS (ABSOLUTE): 0 10*3/uL (ref 0.0–0.4)
Eos: 1 %
Hematocrit: 38.7 % (ref 34.0–46.6)
Hemoglobin: 13.4 g/dL (ref 11.1–15.9)
Immature Grans (Abs): 0 10*3/uL (ref 0.0–0.1)
Immature Granulocytes: 0 %
Lymphocytes Absolute: 2 10*3/uL (ref 0.7–3.1)
Lymphs: 30 %
MCH: 32.1 pg (ref 26.6–33.0)
MCHC: 34.6 g/dL (ref 31.5–35.7)
MCV: 93 fL (ref 79–97)
Monocytes Absolute: 0.4 10*3/uL (ref 0.1–0.9)
Monocytes: 6 %
Neutrophils Absolute: 4.2 10*3/uL (ref 1.4–7.0)
Neutrophils: 62 %
Platelets: 250 10*3/uL (ref 150–450)
RBC: 4.17 x10E6/uL (ref 3.77–5.28)
RDW: 12.6 % (ref 11.7–15.4)
WBC: 6.6 10*3/uL (ref 3.4–10.8)

## 2020-06-22 LAB — LIPID PANEL
Chol/HDL Ratio: 2.2 ratio (ref 0.0–4.4)
Cholesterol, Total: 188 mg/dL (ref 100–199)
HDL: 86 mg/dL (ref >39)
LDL Chol Calc (NIH): 77 mg/dL (ref 0–99)
Triglycerides: 148 mg/dL (ref 0–149)
VLDL Cholesterol Cal: 25 mg/dL (ref 5–40)

## 2020-06-22 LAB — THYROID PANEL WITH TSH
Free Thyroxine Index: 2.3 (ref 1.2–4.9)
T3 Uptake Ratio: 26 % (ref 24–39)
T4, Total: 8.8 ug/dL (ref 4.5–12.0)
TSH: 2.14 u[IU]/mL (ref 0.450–4.500)

## 2020-07-25 ENCOUNTER — Other Ambulatory Visit: Payer: Self-pay

## 2020-07-25 ENCOUNTER — Ambulatory Visit
Admission: RE | Admit: 2020-07-25 | Discharge: 2020-07-25 | Disposition: A | Discharge status: Home / Self Care | Payer: Federal, State, Local not specified - PPO | Source: Ambulatory Visit | Attending: Nurse Practitioner | Admitting: Nurse Practitioner

## 2020-07-25 DIAGNOSIS — Z1231 Encounter for screening mammogram for malignant neoplasm of breast: Secondary | ICD-10-CM

## 2020-08-20 DIAGNOSIS — Z01419 Encounter for gynecological examination (general) (routine) without abnormal findings: Secondary | ICD-10-CM | POA: Diagnosis not present

## 2020-08-20 DIAGNOSIS — Z6824 Body mass index (BMI) 24.0-24.9, adult: Secondary | ICD-10-CM | POA: Diagnosis not present

## 2020-08-22 ENCOUNTER — Other Ambulatory Visit: Payer: Self-pay | Admitting: Obstetrics & Gynecology

## 2020-08-22 DIAGNOSIS — Z13820 Encounter for screening for osteoporosis: Secondary | ICD-10-CM

## 2020-09-04 DIAGNOSIS — D1801 Hemangioma of skin and subcutaneous tissue: Secondary | ICD-10-CM | POA: Diagnosis not present

## 2020-09-04 DIAGNOSIS — L814 Other melanin hyperpigmentation: Secondary | ICD-10-CM | POA: Diagnosis not present

## 2020-09-04 DIAGNOSIS — L905 Scar conditions and fibrosis of skin: Secondary | ICD-10-CM | POA: Diagnosis not present

## 2020-09-04 DIAGNOSIS — D225 Melanocytic nevi of trunk: Secondary | ICD-10-CM | POA: Diagnosis not present

## 2020-10-16 ENCOUNTER — Other Ambulatory Visit: Payer: Self-pay | Admitting: Obstetrics & Gynecology

## 2020-10-16 DIAGNOSIS — Z13820 Encounter for screening for osteoporosis: Secondary | ICD-10-CM

## 2020-12-25 ENCOUNTER — Telehealth: Payer: Self-pay

## 2020-12-25 DIAGNOSIS — E782 Mixed hyperlipidemia: Secondary | ICD-10-CM

## 2020-12-25 DIAGNOSIS — E034 Atrophy of thyroid (acquired): Secondary | ICD-10-CM

## 2020-12-25 MED ORDER — ROSUVASTATIN CALCIUM 10 MG PO TABS: 10.0000 mg | ORAL_TABLET | Freq: Every day | ORAL | 0 refills | Status: DC

## 2020-12-25 MED ORDER — LEVOTHYROXINE SODIUM 125 MCG PO TABS: 125.0000 ug | ORAL_TABLET | Freq: Every day | ORAL | 0 refills | Status: DC

## 2020-12-25 NOTE — Telephone Encounter (Signed)
Sent for pt  

## 2020-12-25 NOTE — Telephone Encounter (Signed)
  Prescription Request  12/25/2020  What is the name of the medication or equipment? levothyroxine (SYNTHROID) 125 MCG tablet  rosuvastatin (CRESTOR) 10 MG tablet    Have you contacted your pharmacy to request a refill? (if applicable)Yes  Which pharmacy would you like this sent to? [facility] pt has appt with [facility] on 01/22/21 will run out of meds before appt   Patient notified that their request is being sent to the clinical staff for review and that they should receive a response within 2 business days.

## 2021-01-06 DIAGNOSIS — L03011 Cellulitis of right finger: Secondary | ICD-10-CM | POA: Diagnosis not present

## 2021-01-06 DIAGNOSIS — M79672 Pain in left foot: Secondary | ICD-10-CM | POA: Diagnosis not present

## 2021-01-22 ENCOUNTER — Ambulatory Visit: Payer: Federal, State, Local not specified - PPO | Admitting: Nurse Practitioner

## 2021-01-22 ENCOUNTER — Other Ambulatory Visit: Payer: Self-pay

## 2021-01-22 ENCOUNTER — Encounter: Payer: Self-pay | Admitting: Nurse Practitioner

## 2021-01-22 VITALS — BP 114/66 | HR 65 | Temp 97.7°F | Resp 20 | Ht 69.0 in | Wt 167.0 lb

## 2021-01-22 DIAGNOSIS — E034 Atrophy of thyroid (acquired): Secondary | ICD-10-CM

## 2021-01-22 DIAGNOSIS — Z6827 Body mass index (BMI) 27.0-27.9, adult: Secondary | ICD-10-CM

## 2021-01-22 DIAGNOSIS — F17200 Nicotine dependence, unspecified, uncomplicated: Secondary | ICD-10-CM

## 2021-01-22 DIAGNOSIS — E782 Mixed hyperlipidemia: Secondary | ICD-10-CM

## 2021-01-22 DIAGNOSIS — K219 Gastro-esophageal reflux disease without esophagitis: Secondary | ICD-10-CM | POA: Diagnosis not present

## 2021-01-22 DIAGNOSIS — J301 Allergic rhinitis due to pollen: Secondary | ICD-10-CM | POA: Diagnosis not present

## 2021-01-22 MED ORDER — LEVOTHYROXINE SODIUM 125 MCG PO TABS: 125.0000 ug | ORAL_TABLET | Freq: Every day | ORAL | 1 refills | Status: DC

## 2021-01-22 MED ORDER — ROSUVASTATIN CALCIUM 10 MG PO TABS: 10.0000 mg | ORAL_TABLET | Freq: Every day | ORAL | 1 refills | Status: DC

## 2021-01-22 MED ORDER — CHANTIX STARTING MONTH PAK 0.5 MG X 11 & 1 MG X 42 PO TABS: ORAL_TABLET | ORAL | 0 refills | Status: DC

## 2021-01-22 MED ORDER — VARENICLINE TARTRATE 1 MG PO TABS: 1.0000 mg | ORAL_TABLET | Freq: Two times a day (BID) | ORAL | 2 refills | Status: DC

## 2021-01-22 NOTE — Progress Notes (Addendum)
Subjective:    Patient ID: Melissa Gilbert, female    DOB: 1956/06/13, 65 y.o.   MRN: [id number]   Chief Complaint: medical management of chronic issues     HPI:  1. Mixed hyperlipidemia Does not watch diet very closely and does no dedicated exercise. Lab Results  Component Value Date   CHOL 188 06/21/2020   HDL 86 06/21/2020   LDLCALC 77 06/21/2020   TRIG 148 06/21/2020   CHOLHDL 2.2 06/21/2020   The 10-year ASCVD risk score Denman George DC Jr., et al., 2013) is: 4.8%     2. Hypothyroidism due to acquired atrophy of thyroid No problems that aware of. Lab Results  Component Value Date   TSH 2.140 06/21/2020     3. Gastroesophageal reflux disease without esophagitis Is on kto diet and has no symptom of reflux  4. Seasonal allergic rhinitis due to pollen Uses flonase on a regular basis. No complaints  5. BMI 27.0-27.9,adult No recent weight changes Wt Readings from Last 3 Encounters:  01/22/21 167 lb (75.8 kg)  06/21/20 167 lb (75.8 kg)  12/20/19 198 lb (89.8 kg)    BMI Readings from Last 3 Encounters:  06/21/20 24.66 kg/m  12/20/19 29.24 kg/m  08/22/19 27.56 kg/m    * was on chantix and stopped smoking- had some drama in the family and started again. Wants to start chantix again.   Outpatient Encounter Medications as of 01/22/2021  Medication Sig  . Black Cohosh 40 MG CAPS Take 40 mg by mouth 3 (three) times daily.  . Cholecalciferol (VITAMIN D3) 2000 UNITS capsule Take 2,000 Units by mouth daily.  Marland Kitchen conjugated estrogens (PREMARIN) vaginal cream Place 1 Applicatorful vaginally daily.  . diclofenac sodium (VOLTAREN) 1 % GEL Apply 4 g topically 4 (four) times daily. (Patient taking differently: Apply 4 g topically 4 (four) times daily as needed. )  . fluticasone (FLONASE) 50 MCG/ACT nasal spray Place 2 sprays into both nostrils daily.  Marland Kitchen levocetirizine (XYZAL) 5 MG tablet Take 1 tablet (5 mg total) by mouth every evening.  Marland Kitchen levothyroxine (SYNTHROID) 125 MCG  tablet Take 1 tablet (125 mcg total) by mouth daily before breakfast.  . meclizine (ANTIVERT) 25 MG tablet TAKE (1) TABLET THREE TIMES DAILY AS NEEDED FOR DIZZINESS.  . rosuvastatin (CRESTOR) 10 MG tablet Take 1 tablet (10 mg total) by mouth daily.  . varenicline (CHANTIX CONTINUING MONTH PAK) 1 MG tablet Take 1 tablet (1 mg total) by mouth 2 (two) times daily.  . varenicline (CHANTIX STARTING MONTH PAK) 0.5 MG X 11 & 1 MG X 42 tablet Take one 0.5 mg tablet by mouth once daily for 3 days, then increase to one 0.5 mg tablet twice daily for 4 days, then increase to one 1 mg tablet twice daily.     Past Surgical History:  Procedure Laterality Date  . EYE SURGERY    . HAMMER TOE SURGERY    . RIGHT EYE SURGERY    . TUBAL LIGATION      Family History  Problem Relation Age of Onset  . Alzheimer's disease Mother   . Hypothyroidism Mother   . Heart disease Father   . Cancer Father        prostate cancer  . Hyperlipidemia Father   . Hypothyroidism Sister     New complaints: None today  Social history: Lives with her husband - is a Paramedic: n/a    Review of Systems  Constitutional: Negative for diaphoresis.  Eyes: Negative for pain.  Respiratory: Negative for shortness of breath.   Cardiovascular: Negative for chest pain, palpitations and leg swelling.  Gastrointestinal: Negative for abdominal pain.  Endocrine: Negative for polydipsia.  Skin: Negative for rash.  Neurological: Negative for dizziness, weakness and headaches.  Hematological: Does not bruise/bleed easily.  All other systems reviewed and are negative.      Objective:   Physical Exam Vitals and nursing note reviewed.  Constitutional:      General: She is not in acute distress.    Appearance: Normal appearance. She is well-developed and well-nourished.  HENT:     Head: Normocephalic.     Nose: Nose normal.     Mouth/Throat:     Mouth: Oropharynx is clear and moist.  Eyes:      Extraocular Movements: EOM normal.     Pupils: Pupils are equal, round, and reactive to light.  Neck:     Vascular: No carotid bruit or JVD.  Cardiovascular:     Rate and Rhythm: Normal rate and regular rhythm.     Pulses: Intact distal pulses.     Heart sounds: Normal heart sounds.  Pulmonary:     Effort: Pulmonary effort is normal. No respiratory distress.     Breath sounds: Normal breath sounds. No wheezing or rales.  Chest:     Chest wall: No tenderness.  Abdominal:     General: Bowel sounds are normal. There is no distension or abdominal bruit. Aorta is normal.     Palpations: Abdomen is soft. There is no hepatomegaly, splenomegaly, mass or pulsatile mass.     Tenderness: There is no abdominal tenderness.  Musculoskeletal:        General: No edema. Normal range of motion.     Cervical back: Normal range of motion and neck supple.  Lymphadenopathy:     Cervical: No cervical adenopathy.  Skin:    General: Skin is warm and dry.  Neurological:     Mental Status: She is alert and oriented to person, place, and time.     Deep Tendon Reflexes: Reflexes are normal and symmetric.  Psychiatric:        Mood and Affect: Mood and affect normal.        Behavior: Behavior normal.        Thought Content: Thought content normal.        Judgment: Judgment normal.     BP 114/66   Pulse 65   Temp 97.7 F (36.5 C) (Temporal)   Resp 20   Ht 5\' 9"  (1.753 m)   Wt 167 lb (75.8 kg)   SpO2 96%   BMI 24.66 kg/m        Assessment & Plan:  Melissa Gilbert comes in today with chief complaint of Medical Management of Chronic Issues   Diagnosis and orders addressed:  1. Mixed hyperlipidemia Low fat diet - rosuvastatin (CRESTOR) 10 MG tablet; Take 1 tablet (10 mg total) by mouth daily.  Dispense: 90 tablet; Refill: 1  2. Hypothyroidism due to acquired atrophy of thyroid Labs pending - levothyroxine (SYNTHROID) 125 MCG tablet; Take 1 tablet (125 mcg total) by mouth daily before  breakfast.  Dispense: 90 tablet; Refill: 1  3. Gastroesophageal reflux disease without esophagitis Avoid spicy foods Do not eat 2 hours prior to bedtime  4. Seasonal allergic rhinitis due to pollen Xyzal as needed  5. BMI 27.0-27.9,adult Discussed diet and exercise for person with BMI >25 Will recheck weight in 3-6 months  6.  Smoker chantix refilled  Labs pending Health Maintenance reviewed Diet and exercise encouraged  Follow up plan: 6 months   Mary-Margaret Daphine Deutscher, FNP

## 2021-01-22 NOTE — Patient Instructions (Signed)
Food Choices for Gastroesophageal Reflux Disease, Adult When you have gastroesophageal reflux disease (GERD), the foods you eat and your eating habits are very important. Choosing the right foods can help ease your discomfort. Think about working with a food expert (dietitian) to help you make good choices. What are tips for following this plan? Reading food labels  Look for foods that are low in saturated fat. Foods that may help with your symptoms include: ? Foods that have less than 5% of daily value (DV) of fat. ? Foods that have 0 grams of trans fat. Cooking  Do not fry your food.  Cook your food by baking, steaming, grilling, or broiling. These are all methods that do not need a lot of fat for cooking.  To add flavor, try to use herbs that are low in spice and acidity. Meal planning  Choose healthy foods that are low in fat, such as: ? Fruits and vegetables. ? Whole grains. ? Low-fat dairy products. ? Lean meats, fish, and poultry.  Eat small meals often instead of eating 3 large meals each day. Eat your meals slowly in a place where you are relaxed. Avoid bending over or lying down until 2-3 hours after eating.  Limit high-fat foods such as fatty meats or fried foods.  Limit your intake of fatty foods, such as oils, butter, and shortening.  Avoid the following as told by your doctor: ? Foods that cause symptoms. These may be different for different people. Keep a food diary to keep track of foods that cause symptoms. ? Alcohol. ? Drinking a lot of liquid with meals. ? Eating meals during the 2-3 hours before bed.   Lifestyle  Stay at a healthy weight. Ask your doctor what weight is healthy for you. If you need to lose weight, work with your doctor to do so safely.  Exercise for at least 30 minutes on 5 or more days each week, or as told by your doctor.  Wear loose-fitting clothes.  Do not smoke or use any products that contain nicotine or tobacco. If you need help  quitting, ask your doctor.  Sleep with the head of your bed higher than your feet. Use a wedge under the mattress or blocks under the bed frame to raise the head of the bed.  Chew sugar-free gum after meals. What foods should eat? Eat a healthy, well-balanced diet of fruits, vegetables, whole grains, low-fat dairy products, lean meats, fish, and poultry. Each person is different. Foods that may cause symptoms in one person may not cause any symptoms in another person. Work with your doctor to find foods that are safe for you. The items listed above may not be a complete list of what you can eat and drink. Contact a food expert for more options.   What foods should I avoid? Limiting some of these foods may help in managing the symptoms of GERD. Everyone is different. Talk with a food expert or your doctor to help you find the exact foods to avoid, if any. Fruits Any fruits prepared with added fat. Any fruits that cause symptoms. For some people, this may include citrus fruits, such as oranges, grapefruit, pineapple, and lemons. Vegetables Deep-fried vegetables. French fries. Any vegetables prepared with added fat. Any vegetables that cause symptoms. For some people, this may include tomatoes and tomato products, chili peppers, onions and garlic, and horseradish. Grains Pastries or quick breads with added fat. Meats and other proteins High-fat meats, such as fatty beef or pork,   hot dogs, ribs, ham, sausage, salami, and bacon. Fried meat or protein, including fried fish and fried chicken. Nuts and nut butters, in large amounts. Dairy Whole milk and chocolate milk. Sour cream. Cream. Ice cream. Cream cheese. Milkshakes. Fats and oils Butter. Margarine. Shortening. Ghee. Beverages Coffee and tea, with or without caffeine. Carbonated beverages. Sodas. Energy drinks. Fruit juice made with acidic fruits, such as orange or grapefruit. Tomato juice. Alcoholic drinks. Sweets and desserts Chocolate and  cocoa. Donuts. Seasonings and condiments Pepper. Peppermint and spearmint. Added salt. Any condiments, herbs, or seasonings that cause symptoms. For some people, this may include curry, hot sauce, or vinegar-based salad dressings. The items listed above may not be a complete list of what you should not eat and drink. Contact a food expert for more options. Questions to ask your doctor Diet and lifestyle changes are often the first steps that are taken to manage symptoms of GERD. If diet and lifestyle changes do not help, talk with your doctor about taking medicines. Where to find more information  [employer] G[employer]intestinal [employer]: aboutgerd.org Summary  When you have GERD, food and lifestyle choices are very important in easing your symptoms.  Eat small meals often instead of 3 large meals a day. Eat your meals slowly and in a place where you are relaxed.  Avoid bending over or lying down until 2-3 hours after eating.  Limit high-fat foods such as fatty meats or fried foods. This information is not intended to replace advice given to you by your health care provider. Make sure you discuss any questions you have with your health care provider. Document Revised: 05/28/2020 Document Reviewed: 05/28/2020 Elsevier Patient Education  2021 [employer]  

## 2021-01-22 NOTE — Addendum Note (Signed)
Addended by: Bennie Pierini on: 01/22/2021 02:33 PM   Modules accepted: Orders

## 2021-01-23 LAB — CBC WITH DIFFERENTIAL/PLATELET
Basophils Absolute: 0.1 10*3/uL (ref 0.0–0.2)
Basos: 1 %
EOS (ABSOLUTE): 0.1 10*3/uL (ref 0.0–0.4)
Eos: 1 %
Hematocrit: 39 % (ref 34.0–46.6)
Hemoglobin: 13 g/dL (ref 11.1–15.9)
Immature Grans (Abs): 0 10*3/uL (ref 0.0–0.1)
Immature Granulocytes: 0 %
Lymphocytes Absolute: 2 10*3/uL (ref 0.7–3.1)
Lymphs: 38 %
MCH: 30.8 pg (ref 26.6–33.0)
MCHC: 33.3 g/dL (ref 31.5–35.7)
MCV: 92 fL (ref 79–97)
Monocytes Absolute: 0.3 10*3/uL (ref 0.1–0.9)
Monocytes: 6 %
Neutrophils Absolute: 2.9 10*3/uL (ref 1.4–7.0)
Neutrophils: 54 %
Platelets: 236 10*3/uL (ref 150–450)
RBC: 4.22 x10E6/uL (ref 3.77–5.28)
RDW: 12.6 % (ref 11.7–15.4)
WBC: 5.4 10*3/uL (ref 3.4–10.8)

## 2021-01-23 LAB — CMP14+EGFR
ALT: 16 IU/L (ref 0–32)
AST: 24 IU/L (ref 0–40)
Albumin/Globulin Ratio: 3.1 — ABNORMAL HIGH (ref 1.2–2.2)
Albumin: 4.9 g/dL — ABNORMAL HIGH (ref 3.8–4.8)
Alkaline Phosphatase: 62 IU/L (ref 44–121)
BUN/Creatinine Ratio: 16 (ref 12–28)
BUN: 13 mg/dL (ref 8–27)
Bilirubin Total: <0.2 mg/dL (ref 0.0–1.2)
CO2: 25 mmol/L (ref 20–29)
Calcium: 9.6 mg/dL (ref 8.7–10.3)
Chloride: 101 mmol/L (ref 96–106)
Creatinine, Ser: 0.82 mg/dL (ref 0.57–1.00)
GFR calc Af Amer: 89 mL/min/{1.73_m2} (ref >59)
GFR calc non Af Amer: 77 mL/min/{1.73_m2} (ref >59)
Globulin, Total: 1.6 g/dL (ref 1.5–4.5)
Glucose: 104 mg/dL — ABNORMAL HIGH (ref 65–99)
Potassium: 4.4 mmol/L (ref 3.5–5.2)
Sodium: 142 mmol/L (ref 134–144)
Total Protein: 6.5 g/dL (ref 6.0–8.5)

## 2021-01-23 LAB — THYROID PANEL WITH TSH
Free Thyroxine Index: 2.6 (ref 1.2–4.9)
T3 Uptake Ratio: 26 % (ref 24–39)
T4, Total: 9.9 ug/dL (ref 4.5–12.0)
TSH: 0.679 u[IU]/mL (ref 0.450–4.500)

## 2021-01-23 LAB — LIPID PANEL
Chol/HDL Ratio: 2.2 ratio (ref 0.0–4.4)
Cholesterol, Total: 219 mg/dL — ABNORMAL HIGH (ref 100–199)
HDL: 99 mg/dL (ref >39)
LDL Chol Calc (NIH): 108 mg/dL — ABNORMAL HIGH (ref 0–99)
Triglycerides: 66 mg/dL (ref 0–149)
VLDL Cholesterol Cal: 12 mg/dL (ref 5–40)

## 2021-01-29 ENCOUNTER — Other Ambulatory Visit: Payer: Federal, State, Local not specified - PPO

## 2021-03-05 DIAGNOSIS — L905 Scar conditions and fibrosis of skin: Secondary | ICD-10-CM | POA: Diagnosis not present

## 2021-03-05 DIAGNOSIS — B078 Other viral warts: Secondary | ICD-10-CM | POA: Diagnosis not present

## 2021-03-05 DIAGNOSIS — L570 Actinic keratosis: Secondary | ICD-10-CM | POA: Diagnosis not present

## 2021-03-05 DIAGNOSIS — D225 Melanocytic nevi of trunk: Secondary | ICD-10-CM | POA: Diagnosis not present

## 2021-03-05 DIAGNOSIS — L821 Other seborrheic keratosis: Secondary | ICD-10-CM | POA: Diagnosis not present

## 2021-04-01 ENCOUNTER — Other Ambulatory Visit: Payer: Self-pay

## 2021-04-01 ENCOUNTER — Ambulatory Visit
Admission: RE | Admit: 2021-04-01 | Discharge: 2021-04-01 | Disposition: A | Discharge status: Home / Self Care | Payer: Federal, State, Local not specified - PPO | Source: Ambulatory Visit | Attending: Obstetrics & Gynecology | Admitting: Obstetrics & Gynecology

## 2021-04-01 DIAGNOSIS — Z780 Asymptomatic menopausal state: Secondary | ICD-10-CM | POA: Diagnosis not present

## 2021-04-01 DIAGNOSIS — Z13820 Encounter for screening for osteoporosis: Secondary | ICD-10-CM

## 2021-06-28 ENCOUNTER — Other Ambulatory Visit: Payer: Self-pay | Admitting: Nurse Practitioner

## 2021-07-23 ENCOUNTER — Ambulatory Visit: Payer: Federal, State, Local not specified - PPO | Admitting: Nurse Practitioner

## 2021-07-23 ENCOUNTER — Other Ambulatory Visit: Payer: Self-pay

## 2021-07-23 ENCOUNTER — Encounter: Payer: Self-pay | Admitting: Nurse Practitioner

## 2021-07-23 VITALS — BP 106/62 | HR 70 | Temp 97.6°F | Resp 20 | Ht 69.0 in | Wt 167.0 lb

## 2021-07-23 DIAGNOSIS — E782 Mixed hyperlipidemia: Secondary | ICD-10-CM | POA: Diagnosis not present

## 2021-07-23 DIAGNOSIS — R079 Chest pain, unspecified: Secondary | ICD-10-CM | POA: Diagnosis not present

## 2021-07-23 DIAGNOSIS — Z6827 Body mass index (BMI) 27.0-27.9, adult: Secondary | ICD-10-CM

## 2021-07-23 DIAGNOSIS — E034 Atrophy of thyroid (acquired): Secondary | ICD-10-CM | POA: Diagnosis not present

## 2021-07-23 DIAGNOSIS — K219 Gastro-esophageal reflux disease without esophagitis: Secondary | ICD-10-CM

## 2021-07-23 MED ORDER — LEVOTHYROXINE SODIUM 125 MCG PO TABS: 125.0000 ug | ORAL_TABLET | Freq: Every day | ORAL | 1 refills | Status: DC

## 2021-07-23 MED ORDER — TRIAMCINOLONE ACETONIDE 0.1 % EX CREA: 1.0000 "application " | TOPICAL_CREAM | Freq: Two times a day (BID) | CUTANEOUS | 1 refills | Status: AC

## 2021-07-23 MED ORDER — ROSUVASTATIN CALCIUM 10 MG PO TABS: 10.0000 mg | ORAL_TABLET | Freq: Every day | ORAL | 1 refills | Status: DC

## 2021-07-23 NOTE — Addendum Note (Signed)
Addended by: Bennie Pierini on: 07/23/2021 04:34 PM   Modules accepted: Orders

## 2021-07-23 NOTE — Progress Notes (Addendum)
Subjective:    Patient ID: Melissa Gilbert, female    DOB: [birth year], 65 y.o.   MRN: [id number]   Chief Complaint: medical management of chronic issues     HPI:  1. Mixed hyperlipidemia Does try to watch diet. Stays very active but does no dedicated exercise. Lab Results  Component Value Date   CHOL 219 (H) 01/22/2021   HDL 99 01/22/2021   LDLCALC 108 (H) 01/22/2021   TRIG 66 01/22/2021   CHOLHDL 2.2 01/22/2021     2. Hypothyroidism due to acquired atrophy of thyroid No problems that she is aware of Lab Results  Component Value Date   TSH 0.679 01/22/2021     3. Gastroesophageal reflux disease without esophagitis On no prescription meds for this. Has occasional symptoms.  4. BMI 27.0-27.9,adult No recent weight changes Wt Readings from Last 3 Encounters:  07/23/21 167 lb (75.8 kg)  01/22/21 167 lb (75.8 kg)  06/21/20 167 lb (75.8 kg)   BMI Readings from Last 3 Encounters:  07/23/21 24.66 kg/m  01/22/21 24.66 kg/m  06/21/20 24.66 kg/m         Outpatient Encounter Medications as of 07/23/2021  Medication Sig   Black Cohosh 40 MG CAPS Take 40 mg by mouth 3 (three) times daily.   Cholecalciferol (VITAMIN D3) 2000 UNITS capsule Take 2,000 Units by mouth daily.   diclofenac sodium (VOLTAREN) 1 % GEL Apply 4 g topically 4 (four) times daily. (Patient taking differently: Apply 4 g topically 4 (four) times daily as needed.)   fluticasone (FLONASE) 50 MCG/ACT nasal spray Place 2 sprays into both nostrils daily.   levocetirizine (XYZAL) 5 MG tablet Take 1 tablet (5 mg total) by mouth every evening.   levothyroxine (SYNTHROID) 125 MCG tablet Take 1 tablet (125 mcg total) by mouth daily before breakfast.   PREMARIN vaginal cream INSERT (1) APPLICATORFUL INTO THE VAGINA EVERY DAY -FOR VAGINAL USE-   rosuvastatin (CRESTOR) 10 MG tablet Take 1 tablet (10 mg total) by mouth daily.   varenicline (CHANTIX CONTINUING MONTH PAK) 1 MG tablet Take 1 tablet (1 mg total) by  mouth 2 (two) times daily.   varenicline (CHANTIX STARTING MONTH PAK) 0.5 MG X 11 & 1 MG X 42 tablet Take one 0.5 mg tablet by mouth once daily for 3 days, then increase to one 0.5 mg tablet twice daily for 4 days, then increase to one 1 mg tablet twice daily.   No facility-administered encounter medications on file as of 07/23/2021.    Past Surgical History:  Procedure Laterality Date   EYE SURGERY     HAMMER TOE SURGERY     RIGHT EYE SURGERY     TUBAL LIGATION      Family History  Problem Relation Age of Onset   Alzheimer's disease Mother    Hypothyroidism Mother    Heart disease Father    Cancer Father        prostate cancer   Hyperlipidemia Father    Hypothyroidism Sister     New complaints: Has occasional chest heaviness in the mornings when gets up in the mornings.  Social history: Lives with her husband  Controlled substance contract: n/a     Review of Systems  Constitutional:  Negative for diaphoresis.  Eyes:  Negative for pain.  Respiratory:  Negative for shortness of breath.   Cardiovascular:  Negative for chest pain, palpitations and leg swelling.  Gastrointestinal:  Negative for abdominal pain.  Endocrine: Negative for polydipsia.  Skin:  Negative for rash.  Neurological:  Negative for dizziness, weakness and headaches.  Hematological:  Does not bruise/bleed easily.  All other systems reviewed and are negative.     Objective:   Physical Exam Vitals and nursing note reviewed.  Constitutional:      General: She is not in acute distress.    Appearance: Normal appearance. She is well-developed.  HENT:     Head: Normocephalic.     Right Ear: Tympanic membrane normal.     Left Ear: Tympanic membrane normal.     Nose: Nose normal.     Mouth/Throat:     Mouth: Mucous membranes are moist.  Eyes:     Pupils: Pupils are equal, round, and reactive to light.  Neck:     Vascular: No carotid bruit or JVD.  Cardiovascular:     Rate and Rhythm: Normal rate  and regular rhythm.     Heart sounds: Normal heart sounds.  Pulmonary:     Effort: Pulmonary effort is normal. No respiratory distress.     Breath sounds: Normal breath sounds. No wheezing or rales.  Chest:     Chest wall: No tenderness.  Abdominal:     General: Bowel sounds are normal. There is no distension or abdominal bruit.     Palpations: Abdomen is soft. There is no hepatomegaly, splenomegaly, mass or pulsatile mass.     Tenderness: There is no abdominal tenderness.  Musculoskeletal:        General: Normal range of motion.     Cervical back: Normal range of motion and neck supple.  Lymphadenopathy:     Cervical: No cervical adenopathy.  Skin:    General: Skin is warm and dry.  Neurological:     Mental Status: She is alert and oriented to person, place, and time.     Deep Tendon Reflexes: Reflexes are normal and symmetric.  Psychiatric:        Behavior: Behavior normal.        Thought Content: Thought content normal.        Judgment: Judgment normal.    BP 106/62   Pulse 70   Temp 97.6 F (36.4 C) (Temporal)   Resp 20   Ht 5' 9"  (1.753 m)   Wt 167 lb (75.8 kg)   SpO2 94%   BMI 24.66 kg/m   EKG- Kerry Hough, FNP       Assessment & Plan:  ALTON TREMBLAY comes in today with chief complaint of Medical Management of Chronic Issues   Diagnosis and orders addressed:  1. Mixed hyperlipidemia Low fat diet- has been doing keto diet to maintain current weight - CBC with Differential/Platelet - CMP14+EGFR - Lipid panel  2. Hypothyroidism due to acquired atrophy of thyroid Labs pending - Thyroid Panel With TSH  3. Gastroesophageal reflux disease without esophagitis Avoid spicy foods Do not eat 2 hours prior to bedtime   4. BMI 27.0-27.9,adult Discussed diet and exercise for person with BMI >25 Will recheck weight in 3-6 months Will do referral to cardiology for work due to age and family history  5. Chest pain, unspecified type Will do  referral to cardiology due to age and family history - EKG 12-Lead - Ambulatory referral to Cardiology  Meds ordered this encounter  Medications   triamcinolone cream (KENALOG) 0.1 %    Sig: Apply 1 application topically 2 (two) times daily.    Dispense:  30 g    Refill:  1    Order Specific Question:  Supervising Provider    Answer:   Caryl Pina A [[id number]]   levothyroxine (SYNTHROID) 125 MCG tablet    Sig: Take 1 tablet (125 mcg total) by mouth daily before breakfast.    Dispense:  90 tablet    Refill:  1    Order Specific Question:   Supervising Provider    Answer:   Caryl Pina A [[id number]]   rosuvastatin (CRESTOR) 10 MG tablet    Sig: Take 1 tablet (10 mg total) by mouth daily.    Dispense:  90 tablet    Refill:  1    Order Specific Question:   Supervising Provider    Answer:   Caryl Pina A [id number]     Labs pending Health Maintenance reviewed Diet and exercise encouraged  Follow up plan: 6 months   Mary-Margaret Hassell Done, FNP

## 2021-07-23 NOTE — Patient Instructions (Signed)
Nonspecific Chest Pain Chest pain can be caused by many different conditions. Some causes of chest pain can be life-threatening. These will require treatment right away. Serious causes of chest pain include: Heart attack. A tear in the body's main blood vessel. Redness and swelling (inflammation) around your heart. Blood clot in your lungs. Other causes of chest pain may not be so serious. These include: Heartburn. Anxiety or stress.  Damage to bones or muscles in your chest. Lung infections. Chest pain can feel like: Pain or discomfort in your chest. Crushing, pressure, aching, or squeezing pain. Burning or tingling. Dull or sharp pain that is worse when you move, cough, or take a deep breath. Pain or discomfort that is also felt in your back, neck, jaw, shoulder, or arm, or pain that spreads to any of these areas. It is hard to know whether your pain is caused by something that is serious or something that is not so serious. So it is important to see your doctor rightaway if you have chest pain. Follow these instructions at home: Medicines Take over-the-counter and prescription medicines only as told by your doctor. If you were prescribed an antibiotic medicine, take it as told by your doctor. Do not stop taking the antibiotic even if you start to feel better. Lifestyle  Rest as told by your doctor. Do not use any products that contain nicotine or tobacco, such as cigarettes, e-cigarettes, and chewing tobacco. If you need help quitting, ask your doctor. Do not drink alcohol. Make lifestyle changes as told by your doctor. These may include: Getting regular exercise. Ask your doctor what activities are safe for you. Eating a heart-healthy diet. A diet and nutrition specialist (dietitian) can help you to learn healthy eating options. Staying at a healthy weight. Treating diabetes or high blood pressure, if needed. Lowering your stress. Activities such as yoga and relaxation techniques  can help.  General instructions Pay attention to any changes in your symptoms. Tell your doctor about them or any new symptoms. Avoid any activities that cause chest pain. Keep all follow-up visits as told by your doctor. This is important. You may need more testing if your chest pain does not go away. Contact a doctor if: Your chest pain does not go away. You feel depressed. You have a fever. Get help right away if: Your chest pain is worse. You have a cough that gets worse, or you cough up blood. You have very bad (severe) pain in your belly (abdomen). You pass out (faint). You have either of these for no clear reason: Sudden chest discomfort. Sudden discomfort in your arms, back, neck, or jaw. You have shortness of breath at any time. You suddenly start to sweat, or your skin gets clammy. You feel sick to your stomach (nauseous). You throw up (vomit). You suddenly feel lightheaded or dizzy. You feel very weak or tired. Your heart starts to beat fast, or it feels like it is skipping beats. These symptoms may be an emergency. Do not wait to see if the symptoms will go away. Get medical help right away. Call your local emergency services (911 in the [place] Do not drive yourself to the hospital. Summary Chest pain can be caused by many different conditions. The cause may be serious and need treatment right away. If you have chest pain, see your doctor right away. Follow your doctor's instructions for taking medicines and making lifestyle changes. Keep all follow-up visits as told by your doctor. This includes visits for any   further testing if your chest pain does not go away. Be sure to know the signs that show that your condition has become worse. Get help right away if you have these symptoms. This information is not intended to replace advice given to you by your health care provider. Make sure you discuss any questions you have with your healthcare provider. Document Revised:  05/20/2018 Document Reviewed: 05/20/2018 Elsevier Patient Education  2022 [employer]vier Inc[employer]  

## 2021-07-24 ENCOUNTER — Other Ambulatory Visit: Payer: Self-pay | Admitting: Psychiatry

## 2021-07-24 ENCOUNTER — Other Ambulatory Visit: Payer: Self-pay | Admitting: Nurse Practitioner

## 2021-07-24 DIAGNOSIS — Z1231 Encounter for screening mammogram for malignant neoplasm of breast: Secondary | ICD-10-CM

## 2021-07-24 LAB — LIPID PANEL
Chol/HDL Ratio: 2.3 ratio (ref 0.0–4.4)
Cholesterol, Total: 215 mg/dL — ABNORMAL HIGH (ref 100–199)
HDL: 94 mg/dL (ref >39)
LDL Chol Calc (NIH): 104 mg/dL — ABNORMAL HIGH (ref 0–99)
Triglycerides: 99 mg/dL (ref 0–149)
VLDL Cholesterol Cal: 17 mg/dL (ref 5–40)

## 2021-07-24 LAB — CBC WITH DIFFERENTIAL/PLATELET
Basophils Absolute: 0.1 10*3/uL (ref 0.0–0.2)
Basos: 1 %
EOS (ABSOLUTE): 0 10*3/uL (ref 0.0–0.4)
Eos: 1 %
Hematocrit: 39.3 % (ref 34.0–46.6)
Hemoglobin: 12.9 g/dL (ref 11.1–15.9)
Immature Grans (Abs): 0 10*3/uL (ref 0.0–0.1)
Immature Granulocytes: 0 %
Lymphocytes Absolute: 1.9 10*3/uL (ref 0.7–3.1)
Lymphs: 35 %
MCH: 30.7 pg (ref 26.6–33.0)
MCHC: 32.8 g/dL (ref 31.5–35.7)
MCV: 94 fL (ref 79–97)
Monocytes Absolute: 0.3 10*3/uL (ref 0.1–0.9)
Monocytes: 6 %
Neutrophils Absolute: 3.1 10*3/uL (ref 1.4–7.0)
Neutrophils: 57 %
Platelets: 225 10*3/uL (ref 150–450)
RBC: 4.2 x10E6/uL (ref 3.77–5.28)
RDW: 12.9 % (ref 11.7–15.4)
WBC: 5.4 10*3/uL (ref 3.4–10.8)

## 2021-07-24 LAB — THYROID PANEL WITH TSH
Free Thyroxine Index: 2.4 (ref 1.2–4.9)
T3 Uptake Ratio: 25 % (ref 24–39)
T4, Total: 9.5 ug/dL (ref 4.5–12.0)
TSH: 1.76 u[IU]/mL (ref 0.450–4.500)

## 2021-07-24 LAB — CMP14+EGFR
ALT: 15 IU/L (ref 0–32)
AST: 27 IU/L (ref 0–40)
Albumin/Globulin Ratio: 2.8 — ABNORMAL HIGH (ref 1.2–2.2)
Albumin: 4.7 g/dL (ref 3.8–4.8)
Alkaline Phosphatase: 64 IU/L (ref 44–121)
BUN/Creatinine Ratio: 22 (ref 12–28)
BUN: 17 mg/dL (ref 8–27)
Bilirubin Total: <0.2 mg/dL (ref 0.0–1.2)
CO2: 27 mmol/L (ref 20–29)
Calcium: 9.6 mg/dL (ref 8.7–10.3)
Chloride: 101 mmol/L (ref 96–106)
Creatinine, Ser: 0.79 mg/dL (ref 0.57–1.00)
Globulin, Total: 1.7 g/dL (ref 1.5–4.5)
Glucose: 100 mg/dL — ABNORMAL HIGH (ref 65–99)
Potassium: 4.4 mmol/L (ref 3.5–5.2)
Sodium: 141 mmol/L (ref 134–144)
Total Protein: 6.4 g/dL (ref 6.0–8.5)
eGFR: 85 mL/min/{1.73_m2} (ref >59)

## 2021-09-03 DIAGNOSIS — L989 Disorder of the skin and subcutaneous tissue, unspecified: Secondary | ICD-10-CM | POA: Diagnosis not present

## 2021-09-03 DIAGNOSIS — L821 Other seborrheic keratosis: Secondary | ICD-10-CM | POA: Diagnosis not present

## 2021-09-03 DIAGNOSIS — L814 Other melanin hyperpigmentation: Secondary | ICD-10-CM | POA: Diagnosis not present

## 2021-09-03 DIAGNOSIS — D485 Neoplasm of uncertain behavior of skin: Secondary | ICD-10-CM | POA: Diagnosis not present

## 2021-09-03 DIAGNOSIS — L538 Other specified erythematous conditions: Secondary | ICD-10-CM | POA: Diagnosis not present

## 2021-09-03 DIAGNOSIS — L820 Inflamed seborrheic keratosis: Secondary | ICD-10-CM | POA: Diagnosis not present

## 2021-09-03 DIAGNOSIS — D225 Melanocytic nevi of trunk: Secondary | ICD-10-CM | POA: Diagnosis not present

## 2021-09-03 DIAGNOSIS — I788 Other diseases of capillaries: Secondary | ICD-10-CM | POA: Diagnosis not present

## 2021-09-03 DIAGNOSIS — L570 Actinic keratosis: Secondary | ICD-10-CM | POA: Diagnosis not present

## 2021-09-05 ENCOUNTER — Encounter: Payer: Self-pay | Admitting: *Deleted

## 2021-09-05 ENCOUNTER — Encounter: Payer: Self-pay | Admitting: Cardiology

## 2021-09-05 ENCOUNTER — Other Ambulatory Visit: Payer: Self-pay

## 2021-09-05 ENCOUNTER — Ambulatory Visit: Payer: Federal, State, Local not specified - PPO | Admitting: Cardiology

## 2021-09-05 ENCOUNTER — Telehealth: Payer: Self-pay | Admitting: Cardiology

## 2021-09-05 VITALS — BP 124/74 | HR 70 | Ht 69.5 in | Wt 169.6 lb

## 2021-09-05 DIAGNOSIS — R079 Chest pain, unspecified: Secondary | ICD-10-CM | POA: Diagnosis not present

## 2021-09-05 NOTE — Patient Instructions (Signed)
Medication Instructions:  °Continue all current medications. ° °Labwork: °none ° °Testing/Procedures: °Your physician has requested that you have an exercise stress myoview. For further information please visit [email or web address]. Please follow instruction sheet, as given.  °Office will contact with results via phone or letter.    ° °Follow-Up: °Pending test results  ° °Any Other Special Instructions Will Be Listed Below (If Applicable). ° ° °If you need a refill on your cardiac medications before your next appointment, please call your pharmacy. ° °

## 2021-09-05 NOTE — Telephone Encounter (Signed)
Checking percert on the following patient for testing scheduled at [facility].     MYOCARDIAL PERFUSION    09/20/2021

## 2021-09-05 NOTE — Progress Notes (Signed)
Clinical Summary Melissa Gilbert is a 65 y.o.female seen today as a new consult, referred by NP Daphine Deutscher for the following medical problems.   Chest pain - symptoms started about 2 months ago - aching pain left sided, 3/10 in severity. Occurs at rest or with activity, often in mornings - no other associated symptoms - can sometimes be better with position - can last up to 10-15 minutes - had been occurring multiple times a week, recently occurring 2-3 times over the last month - rides stationary bike x 20 minutes, ellipitcal machine 10 mintes without symptoms    CAD risk factors: father history CAD in his 63s, hyperlipidemia, +smoker x 40 years off and on.     Past Medical History:  Diagnosis Date   Allergy    Hyperlipidemia    Thyroid disease      No Known Allergies   Current Outpatient Medications  Medication Sig Dispense Refill   Black Cohosh 40 MG CAPS Take 40 mg by mouth 3 (three) times daily.     Cholecalciferol (VITAMIN D3) 2000 UNITS capsule Take 2,000 Units by mouth daily.     fluticasone (FLONASE) 50 MCG/ACT nasal spray Place 2 sprays into both nostrils daily. 16 g 6   levocetirizine (XYZAL) 5 MG tablet Take 1 tablet (5 mg total) by mouth every evening. 30 tablet 5   levothyroxine (SYNTHROID) 125 MCG tablet Take 1 tablet (125 mcg total) by mouth daily before breakfast. 90 tablet 1   PREMARIN vaginal cream INSERT (1) APPLICATORFUL INTO THE VAGINA EVERY DAY -FOR VAGINAL USE- 30 g 12   rosuvastatin (CRESTOR) 10 MG tablet Take 1 tablet (10 mg total) by mouth daily. 90 tablet 1   triamcinolone cream (KENALOG) 0.1 % Apply 1 application topically 2 (two) times daily. 30 g 1   varenicline (CHANTIX CONTINUING MONTH PAK) 1 MG tablet Take 1 tablet (1 mg total) by mouth 2 (two) times daily. (Patient not taking: Reported on 07/23/2021) 60 tablet 2   varenicline (CHANTIX STARTING MONTH PAK) 0.5 MG X 11 & 1 MG X 42 tablet Take one 0.5 mg tablet by mouth once daily for 3 days, then  increase to one 0.5 mg tablet twice daily for 4 days, then increase to one 1 mg tablet twice daily. (Patient not taking: Reported on 07/23/2021) 53 tablet 0   No current facility-administered medications for this visit.     Past Surgical History:  Procedure Laterality Date   EYE SURGERY     HAMMER TOE SURGERY     RIGHT EYE SURGERY     TUBAL LIGATION       No Known Allergies    Family History  Problem Relation Age of Onset   Alzheimer's disease Mother    Hypothyroidism Mother    Heart disease Father    Cancer Father        prostate cancer   Hyperlipidemia Father    Hypothyroidism Sister      Social History Ms. Voshell reports that she has quit smoking. She has never used smokeless tobacco. Ms. Brindley reports no history of alcohol use.   Review of Systems CONSTITUTIONAL: No weight loss, fever, chills, weakness or fatigue.  HEENT: Eyes: No visual loss, blurred vision, double vision or yellow sclerae.No hearing loss, sneezing, congestion, runny nose or sore throat.  SKIN: No rash or itching.  CARDIOVASCULAR: per hpi RESPIRATORY: No shortness of breath, cough or sputum.  GASTROINTESTINAL: No anorexia, nausea, vomiting or diarrhea. No abdominal pain or  blood.  GENITOURINARY: No burning on urination, no polyuria NEUROLOGICAL: No headache, dizziness, syncope, paralysis, ataxia, numbness or tingling in the extremities. No change in bowel or bladder control.  MUSCULOSKELETAL: No muscle, back pain, joint pain or stiffness.  LYMPHATICS: No enlarged nodes. No history of splenectomy.  PSYCHIATRIC: No history of depression or anxiety.  ENDOCRINOLOGIC: No reports of sweating, cold or heat intolerance. No polyuria or polydipsia.  Marland Kitchen   Physical Examination Today's Vitals   09/05/21 1453  BP: 124/74  Pulse: 70  SpO2: 97%  Weight: 169 lb 9.6 oz (76.9 kg)  Height: 5' 9.5" (1.765 m)   Body mass index is 24.69 kg/m.  Gen: resting comfortably, no acute distress HEENT: no scleral  icterus, pupils equal round and reactive, no palptable cervical adenopathy,  CV: RRR, no m/r/g no jvd Resp: Clear to auscultation bilaterally GI: abdomen is soft, non-tender, non-distended, normal bowel sounds, no hepatosplenomegaly MSK: extremities are warm, no edema.  Skin: warm, no rash Neuro:  no focal deficits Psych: appropriate affect    Assessment and Plan  Chest pain - symptoms are mixed for cardiac etiology. Multiple CAD risk factors. EKG reviewed from pcp office, nonspecific inferior ST/T changes - plan for exercise nuclear stress test to further evaluate   F/u pending stress results.    Antoine Poche, M.D.

## 2021-09-20 ENCOUNTER — Other Ambulatory Visit: Payer: Self-pay

## 2021-09-20 ENCOUNTER — Ambulatory Visit (HOSPITAL_COMMUNITY)
Admission: RE | Admit: 2021-09-20 | Discharge: 2021-09-20 | Disposition: A | Discharge status: Home / Self Care | Payer: Federal, State, Local not specified - PPO | Source: Ambulatory Visit | Attending: Cardiology | Admitting: Cardiology

## 2021-09-20 ENCOUNTER — Encounter (HOSPITAL_COMMUNITY): Payer: Self-pay

## 2021-09-20 DIAGNOSIS — R079 Chest pain, unspecified: Secondary | ICD-10-CM

## 2021-09-20 LAB — NM MYOCAR MULTI W/SPECT W/WALL MOTION / EF
Angina Index: 0
Duke Treadmill Score: 9
Estimated workload: 10.1
Exercise duration (min): 9 min
Exercise duration (sec): 28 s
LV dias vol: 111 mL (ref 46–106)
LV sys vol: 50 mL
MPHR: 158 {beats}/min
Nuc Stress EF: 55 %
Peak HR: 136 {beats}/min
Percent HR: 86 %
RATE: 0.4
RPE: 16
Rest HR: 53 {beats}/min
Rest Nuclear Isotope Dose: 10.2 mCi
SDS: 1
SRS: 5
SSS: 6
ST Depression (mm): 0 mm
Stress Nuclear Isotope Dose: 31.9 mCi
TID: 1.05

## 2021-09-20 MED ORDER — TECHNETIUM TC 99M TETROFOSMIN IV KIT
30.0000 | PACK | Freq: Once | INTRAVENOUS | Status: AC | PRN
  Administered 2021-09-20: 31.9 via INTRAVENOUS

## 2021-09-20 MED ORDER — SODIUM CHLORIDE FLUSH 0.9 % IV SOLN
INTRAVENOUS | Status: AC
  Administered 2021-09-20: 10 mL via INTRAVENOUS
  Filled 2021-09-20: qty 10

## 2021-09-20 MED ORDER — REGADENOSON 0.4 MG/5ML IV SOLN
INTRAVENOUS | Status: AC
  Filled 2021-09-20: qty 5

## 2021-09-20 MED ORDER — TECHNETIUM TC 99M TETROFOSMIN IV KIT
10.0000 | PACK | Freq: Once | INTRAVENOUS | Status: AC | PRN
  Administered 2021-09-20: 10.2 via INTRAVENOUS

## 2021-09-25 ENCOUNTER — Other Ambulatory Visit: Payer: Self-pay

## 2021-09-25 ENCOUNTER — Ambulatory Visit
Admission: RE | Admit: 2021-09-25 | Discharge: 2021-09-25 | Disposition: A | Discharge status: Home / Self Care | Payer: Federal, State, Local not specified - PPO | Source: Ambulatory Visit | Attending: Nurse Practitioner | Admitting: Nurse Practitioner

## 2021-09-25 DIAGNOSIS — Z1231 Encounter for screening mammogram for malignant neoplasm of breast: Secondary | ICD-10-CM

## 2021-09-26 ENCOUNTER — Ambulatory Visit: Payer: Federal, State, Local not specified - PPO | Admitting: Internal Medicine

## 2021-09-26 DIAGNOSIS — H10013 Acute follicular conjunctivitis, bilateral: Secondary | ICD-10-CM | POA: Diagnosis not present

## 2021-10-04 ENCOUNTER — Telehealth: Payer: Self-pay | Admitting: *Deleted

## 2021-10-04 NOTE — Telephone Encounter (Signed)
-----   Message from Antoine Poche, MD sent at 10/02/2021 12:51 PM EDT ----- Overall stress test looks good. No findings to support any significant blockages in the heart. Should f/u with pcp to discuss noncardiac causes of her symptoms, can f/u with [place] as needed  Melissa Ferry MD

## 2021-10-04 NOTE — Telephone Encounter (Signed)
Lesle Chris, LPN  38/01/3382  6:47 PM EDT Back to Top    Notified, copy to pcp.

## 2021-11-12 DIAGNOSIS — H10013 Acute follicular conjunctivitis, bilateral: Secondary | ICD-10-CM | POA: Diagnosis not present

## 2021-12-24 DIAGNOSIS — H10013 Acute follicular conjunctivitis, bilateral: Secondary | ICD-10-CM | POA: Diagnosis not present

## 2022-01-02 ENCOUNTER — Other Ambulatory Visit: Payer: Self-pay

## 2022-01-02 DIAGNOSIS — Z1211 Encounter for screening for malignant neoplasm of colon: Secondary | ICD-10-CM

## 2022-01-02 DIAGNOSIS — Z1212 Encounter for screening for malignant neoplasm of rectum: Secondary | ICD-10-CM

## 2022-01-20 LAB — COLOGUARD: COLOGUARD: NEGATIVE

## 2022-01-21 ENCOUNTER — Ambulatory Visit: Payer: Federal, State, Local not specified - PPO | Admitting: Nurse Practitioner

## 2022-01-21 ENCOUNTER — Encounter: Payer: Self-pay | Admitting: Nurse Practitioner

## 2022-01-21 VITALS — BP 124/71 | HR 70 | Temp 97.8°F | Ht 69.5 in | Wt 171.4 lb

## 2022-01-21 DIAGNOSIS — K219 Gastro-esophageal reflux disease without esophagitis: Secondary | ICD-10-CM | POA: Diagnosis not present

## 2022-01-21 DIAGNOSIS — F17200 Nicotine dependence, unspecified, uncomplicated: Secondary | ICD-10-CM

## 2022-01-21 DIAGNOSIS — E034 Atrophy of thyroid (acquired): Secondary | ICD-10-CM | POA: Diagnosis not present

## 2022-01-21 DIAGNOSIS — E782 Mixed hyperlipidemia: Secondary | ICD-10-CM

## 2022-01-21 DIAGNOSIS — Z6827 Body mass index (BMI) 27.0-27.9, adult: Secondary | ICD-10-CM

## 2022-01-21 MED ORDER — CHANTIX STARTING MONTH PAK 0.5 MG X 11 & 1 MG X 42 PO TBPK: 1.0000 | ORAL_TABLET | Freq: Two times a day (BID) | ORAL | 0 refills | Status: DC

## 2022-01-21 MED ORDER — LEVOTHYROXINE SODIUM 125 MCG PO TABS: 125.0000 ug | ORAL_TABLET | Freq: Every day | ORAL | 1 refills | Status: DC

## 2022-01-21 MED ORDER — ROSUVASTATIN CALCIUM 10 MG PO TABS: 10.0000 mg | ORAL_TABLET | Freq: Every day | ORAL | 1 refills | Status: DC

## 2022-01-21 MED ORDER — VARENICLINE TARTRATE 1 MG PO TABS: 1.0000 mg | ORAL_TABLET | Freq: Two times a day (BID) | ORAL | 2 refills | Status: DC

## 2022-01-21 NOTE — Progress Notes (Signed)
Subjective:    Patient ID: Melissa Gilbert, female    DOB: [birth year], 66 y.o.   MRN: [id number]   Chief Complaint: Medical Management of Chronic Issues, Hyperlipidemia, and Hypothyroidism    HPI:  Melissa Gilbert is a 66 y.o. who identifies as a female who was assigned female at birth.   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 Mixed hyperlipidemia Does try to watch diet and stays very active. Lab Results  Component Value Date   CHOL 215 (H) 07/23/2021   HDL 94 07/23/2021   LDLCALC 104 (H) 07/23/2021   TRIG 99 07/23/2021   CHOLHDL 2.3 07/23/2021     2. Hypothyroidism due to acquired atrophy of thyroid No problems that aware of Lab Results  Component Value Date   TSH 1.760 07/23/2021     3. Gastroesophageal reflux disease without esophagitis Is doing good. Only uses otc meds when needed.  4. BMI 27.0-27.9,adult No recent weight changes Wt Readings from Last 3 Encounters:  01/21/22 171 lb 6 oz (77.7 kg)  09/05/21 169 lb 9.6 oz (76.9 kg)  07/23/21 167 lb (75.8 kg)   BMI Readings from Last 3 Encounters:  01/21/22 24.94 kg/m  09/05/21 24.69 kg/m  07/23/21 24.66 kg/m      New complaints: Wants to try chantix again to quit smoking  No Known Allergies Outpatient Encounter Medications as of 01/21/2022  Medication Sig   Black Cohosh 40 MG CAPS Take 40 mg by mouth 3 (three) times daily.   Cholecalciferol (VITAMIN D3) 2000 UNITS capsule Take 2,000 Units by mouth daily.   fluticasone (FLONASE) 50 MCG/ACT nasal spray Place 2 sprays into both nostrils daily.   levocetirizine (XYZAL) 5 MG tablet Take 1 tablet (5 mg total) by mouth every evening.   levothyroxine (SYNTHROID) 125 MCG tablet Take 1 tablet (125 mcg total) by mouth daily before breakfast.   Multiple Vitamin (MULTIVITAMIN) capsule Take 1 capsule by mouth daily.   Omega-3 Fatty Acids (FISH OIL) 1000 MG  CAPS Take by mouth.   PREMARIN vaginal cream INSERT (1) APPLICATORFUL INTO THE VAGINA EVERY DAY -FOR VAGINAL USE-   rosuvastatin (CRESTOR) 10 MG tablet Take 1 tablet (10 mg total) by mouth daily.   S-Adenosylmethionine (SAM-E PO) Take by mouth.   triamcinolone cream (KENALOG) 0.1 % Apply 1 application topically 2 (two) times daily.   varenicline (CHANTIX CONTINUING MONTH PAK) 1 MG tablet Take 1 tablet (1 mg total) by mouth 2 (two) times daily. (Patient not taking: Reported on 01/21/2022)   varenicline (CHANTIX STARTING MONTH PAK) 0.5 MG X 11 & 1 MG X 42 tablet Take one 0.5 mg tablet by mouth once daily for 3 days, then increase to one 0.5 mg tablet twice daily for 4 days, then increase to one 1 mg tablet twice daily. (Patient not taking: Reported on 01/21/2022)   [DISCONTINUED] fexofenadine (ALLEGRA) 180 MG tablet Take 180 mg by mouth daily.   [DISCONTINUED] fluticasone (FLONASE) 50 MCG/ACT nasal spray    No facility-administered encounter medications on file as of 01/21/2022.    Past Surgical History:  Procedure Laterality Date   EYE SURGERY     HAMMER TOE SURGERY     RIGHT EYE SURGERY     TUBAL LIGATION      Family History  Problem Relation Age of Onset   Alzheimer's disease Mother    Hypothyroidism Mother  Heart disease Father    Cancer Father        prostate cancer   Hyperlipidemia Father    Hypothyroidism Sister    Breast cancer Neg Hx       Controlled substance contract: n/a                   Review of Systems  Constitutional:  Negative for diaphoresis.  Eyes:  Negative for pain.  Respiratory:  Negative for shortness of breath.   Cardiovascular:  Negative for chest pain, palpitations and leg swelling.  Gastrointestinal:  Negative for abdominal pain.  Endocrine: Negative for polydipsia.  Skin:  Negative for rash.  Neurological:  Negative for dizziness, weakness and headaches.  Hematological:  Does not bruise/bleed easily.  All other systems reviewed and are  negative.     Objective:   Physical Exam Vitals and nursing note reviewed.  Constitutional:      General: She is not in acute distress.    Appearance: Normal appearance. She is well-developed.  HENT:     Head: Normocephalic.     Right Ear: Tympanic membrane normal.     Left Ear: Tympanic membrane normal.     Nose: Nose normal.     Mouth/Throat:     Mouth: Mucous membranes are moist.  Eyes:     Pupils: Pupils are equal, round, and reactive to light.  Neck:     Vascular: No carotid bruit or JVD.  Cardiovascular:     Rate and Rhythm: Normal rate and regular rhythm.     Heart sounds: Normal heart sounds.  Pulmonary:     Effort: Pulmonary effort is normal. No respiratory distress.     Breath sounds: Normal breath sounds. No wheezing or rales.  Chest:     Chest wall: No tenderness.  Abdominal:     General: Bowel sounds are normal. There is no distension or abdominal bruit.     Palpations: Abdomen is soft. There is no hepatomegaly, splenomegaly, mass or pulsatile mass.     Tenderness: There is no abdominal tenderness.  Musculoskeletal:        General: Normal range of motion.     Cervical back: Normal range of motion and neck supple.  Lymphadenopathy:     Cervical: No cervical adenopathy.  Skin:    General: Skin is warm and dry.  Neurological:     Mental Status: She is alert and oriented to person, place, and time.     Deep Tendon Reflexes: Reflexes are normal and symmetric.  Psychiatric:        Behavior: Behavior normal.        Thought Content: Thought content normal.        Judgment: Judgment normal.    BP 124/71    Pulse 70    Temp 97.8 F (36.6 C) (Temporal)    Ht 5' 9.5" (1.765 m)    Wt 171 lb 6 oz (77.7 kg)    BMI 24.94 kg/m        Assessment & Plan:   Melissa Gilbert comes in today with chief complaint of Medical Management of Chronic Issues, Hyperlipidemia, and Hypothyroidism   Diagnosis and orders addressed:  1. Mixed hyperlipidemia Low fat diet - CBC  with Differential/Platelet - CMP14+EGFR - Lipid panel - rosuvastatin (CRESTOR) 10 MG tablet; Take 1 tablet (10 mg total) by mouth daily.  Dispense: 90 tablet; Refill: 1  2. Hypothyroidism due to acquired atrophy of thyroid Labs pending - Thyroid Panel With TSH -  levothyroxine (SYNTHROID) 125 MCG tablet; Take 1 tablet (125 mcg total) by mouth daily before breakfast.  Dispense: 90 tablet; Refill: 1  3. Gastroesophageal reflux disease without esophagitis .mmmge  4. BMI 27.0-27.9,adult Discussed diet and exercise for person with BMI >25 Will recheck weight in 3-6 months   5. Smoking Smoking cessation encouraged - Varenicline Tartrate, Starter, (CHANTIX STARTING MONTH PAK) 0.5 MG X 11 & 1 MG X 42 TBPK; Take 1 tablet by mouth 2 (two) times daily.  Dispense: 1 each; Refill: 0 - varenicline (CHANTIX CONTINUING MONTH PAK) 1 MG tablet; Take 1 tablet (1 mg total) by mouth 2 (two) times daily.  Dispense: 60 tablet; Refill: 2   Labs pending Health Maintenance reviewed Diet and exercise encouraged  Follow up plan: 6 months   Mary-Margaret Hassell Done, FNP

## 2022-01-21 NOTE — Patient Instructions (Signed)
Managing the Challenge of Quitting Smoking ?Quitting smoking is a physical and mental challenge. You will face cravings, withdrawal symptoms, and temptation. Before quitting, work with your health care provider to make a plan that can help you manage quitting. Preparation can help you quit and keep you from giving in. ?How to manage lifestyle changes ?Managing stress ?Stress can make you want to smoke, and wanting to smoke may cause stress. It is important to find ways to manage your stress. You might try some of the following: ?Practice relaxation techniques. ?Breathe slowly and deeply, in through your nose and out through your mouth. ?Listen to music. ?Soak in a bath or take a shower. ?Imagine a peaceful place or vacation. ?Get some support. ?Talk with family or friends about your stress. ?Join a support group. ?Talk with a counselor or therapist. ?Get some physical activity. ?Go for a walk, run, or bike ride. ?Play a favorite sport. ?Practice yoga. ? ?Medicines ?Talk with your health care provider about medicines that might help you deal with cravings and make quitting easier for you. ?Relationships ?Social situations can be difficult when you are quitting smoking. To manage this, you can: ?Avoid parties and other social situations where people might be smoking. ?Avoid alcohol. ?Leave right away if you have the urge to smoke. ?Explain to your family and friends that you are quitting smoking. Ask for support and let them know you might be a bit grumpy. ?Plan activities where smoking is not an option. ?General instructions ?Be aware that many people gain weight after they quit smoking. However, not everyone does. To keep from gaining weight, have a plan in place before you quit and stick to the plan after you quit. Your plan should include: ?Having healthy snacks. When you have a craving, it may help to: ?Eat popcorn, carrots, celery, or other cut vegetables. ?Chew sugar-free gum. ?Changing how you eat. ?Eat small  portion sizes at meals. ?Eat 4-6 small meals throughout the day instead of 1-2 large meals a day. ?Be mindful when you eat. Do not watch television or do other things that might distract you as you eat. ?Exercising regularly. ?Make time to exercise each day. If you do not have time for a long workout, do short bouts of exercise for 5-10 minutes several times a day. ?Do some form of strengthening exercise, such as weight lifting. ?Do some exercise that gets your heart beating and causes you to breathe deeply, such as walking fast, running, swimming, or biking. This is very important. ?Drinking plenty of water or other low-calorie or no-calorie drinks. Drink 6-8 glasses of water daily. ? ?How to recognize withdrawal symptoms ?Your body and mind may experience discomfort as you try to get used to not having nicotine in your system. These effects are called withdrawal symptoms. They may include: ?Feeling hungrier than normal. ?Having trouble concentrating. ?Feeling irritable or restless. ?Having trouble sleeping. ?Feeling depressed. ?Craving a cigarette. ?To manage withdrawal symptoms: ?Avoid places, people, and activities that trigger your cravings. ?Remember why you want to quit. ?Get plenty of sleep. ?Avoid coffee and other caffeinated drinks. These may worsen some of your symptoms. ?These symptoms may surprise you. But be assured that they are normal to have when quitting smoking. ?How to manage cravings ?Come up with a plan for how to deal with your cravings. The plan should include the following: ?A definition of the specific situation you want to deal with. ?An alternative action you will take. ?A clear idea for how this action   will help. ?The name of someone who might help you with this. ?Cravings usually last for 5-10 minutes. Consider taking the following actions to help you with your plan to deal with cravings: ?Keep your mouth busy. ?Chew sugar-free gum. ?Suck on hard candies or a straw. ?Brush your  teeth. ?Keep your hands and body busy. ?Change to a different activity right away. ?Squeeze or play with a ball. ?Do an activity or a hobby, such as making bead jewelry, practicing needlepoint, or working with wood. ?Mix up your normal routine. ?Take a short exercise break. Go for a quick walk or run up and down stairs. ?Focus on doing something kind or helpful for someone else. ?Call a friend or family member to talk during a craving. ?Join a support group. ?Contact a quitline. ?Where to find support ?To get help or find a support group: ?Call the National Cancer Institute's Smoking Quitline: 1-800-QUIT NOW (784-8669) ?Visit the website of the Substance Abuse and Mental Health Services Administration: www.samhsa.gov ?Text QUIT to SmokefreeTXT: 478848 ?Where to find more information ?Visit these websites to find more information on quitting smoking: ?National Cancer Institute: www.smokefree.gov ?American Lung Association: www.lung.org ?American Cancer Society: www.cancer.org ?Centers for Disease Control and Prevention: www.cdc.gov ?American Heart Association: www.heart.org ?Contact a health care provider if: ?You want to change your plan for quitting. ?The medicines you are taking are not helping. ?Your eating feels out of control or you cannot sleep. ?Get help right away if: ?You feel depressed or become very anxious. ?Summary ?Quitting smoking is a physical and mental challenge. You will face cravings, withdrawal symptoms, and temptation to smoke again. Preparation can help you as you go through these challenges. ?Try different techniques to manage stress, handle social situations, and prevent weight gain. ?You can deal with cravings by keeping your mouth busy (such as by chewing gum), keeping your hands and body busy, calling family or friends, or contacting a quitline for people who want to quit smoking. ?You can deal with withdrawal symptoms by avoiding places where people smoke, getting plenty of rest, and  avoiding drinks with caffeine. ?This information is not intended to replace advice given to you by your health care provider. Make sure you discuss any questions you have with your health care provider. ?Document Revised: 07/26/2021 Document Reviewed: 09/06/2019 ?Elsevier Patient Education ? 2022 Elsevier Inc. ? ?

## 2022-01-22 LAB — THYROID PANEL WITH TSH
Free Thyroxine Index: 2.6 (ref 1.2–4.9)
T3 Uptake Ratio: 26 % (ref 24–39)
T4, Total: 9.9 ug/dL (ref 4.5–12.0)
TSH: 2.1 u[IU]/mL (ref 0.450–4.500)

## 2022-01-22 LAB — CMP14+EGFR
ALT: 17 IU/L (ref 0–32)
AST: 27 IU/L (ref 0–40)
Albumin/Globulin Ratio: 2.5 — ABNORMAL HIGH (ref 1.2–2.2)
Albumin: 4.7 g/dL (ref 3.8–4.8)
Alkaline Phosphatase: 64 IU/L (ref 44–121)
BUN/Creatinine Ratio: 18 (ref 12–28)
BUN: 15 mg/dL (ref 8–27)
Bilirubin Total: <0.2 mg/dL (ref 0.0–1.2)
CO2: 24 mmol/L (ref 20–29)
Calcium: 9.9 mg/dL (ref 8.7–10.3)
Chloride: 101 mmol/L (ref 96–106)
Creatinine, Ser: 0.82 mg/dL (ref 0.57–1.00)
Globulin, Total: 1.9 g/dL (ref 1.5–4.5)
Glucose: 105 mg/dL — ABNORMAL HIGH (ref 70–99)
Potassium: 4.5 mmol/L (ref 3.5–5.2)
Sodium: 141 mmol/L (ref 134–144)
Total Protein: 6.6 g/dL (ref 6.0–8.5)
eGFR: 80 mL/min/{1.73_m2} (ref >59)

## 2022-01-22 LAB — CBC WITH DIFFERENTIAL/PLATELET
Basophils Absolute: 0.1 10*3/uL (ref 0.0–0.2)
Basos: 1 %
EOS (ABSOLUTE): 0.1 10*3/uL (ref 0.0–0.4)
Eos: 1 %
Hematocrit: 39.2 % (ref 34.0–46.6)
Hemoglobin: 13.5 g/dL (ref 11.1–15.9)
Immature Grans (Abs): 0 10*3/uL (ref 0.0–0.1)
Immature Granulocytes: 0 %
Lymphocytes Absolute: 2.5 10*3/uL (ref 0.7–3.1)
Lymphs: 40 %
MCH: 31.7 pg (ref 26.6–33.0)
MCHC: 34.4 g/dL (ref 31.5–35.7)
MCV: 92 fL (ref 79–97)
Monocytes Absolute: 0.4 10*3/uL (ref 0.1–0.9)
Monocytes: 6 %
Neutrophils Absolute: 3.2 10*3/uL (ref 1.4–7.0)
Neutrophils: 52 %
Platelets: 275 10*3/uL (ref 150–450)
RBC: 4.26 x10E6/uL (ref 3.77–5.28)
RDW: 12.7 % (ref 11.7–15.4)
WBC: 6.2 10*3/uL (ref 3.4–10.8)

## 2022-01-22 LAB — LIPID PANEL
Chol/HDL Ratio: 2.3 ratio (ref 0.0–4.4)
Cholesterol, Total: 212 mg/dL — ABNORMAL HIGH (ref 100–199)
HDL: 91 mg/dL (ref >39)
LDL Chol Calc (NIH): 105 mg/dL — ABNORMAL HIGH (ref 0–99)
Triglycerides: 94 mg/dL (ref 0–149)
VLDL Cholesterol Cal: 16 mg/dL (ref 5–40)

## 2022-01-23 ENCOUNTER — Ambulatory Visit: Payer: Federal, State, Local not specified - PPO | Admitting: Nurse Practitioner

## 2022-03-04 DIAGNOSIS — L814 Other melanin hyperpigmentation: Secondary | ICD-10-CM | POA: Diagnosis not present

## 2022-03-04 DIAGNOSIS — D225 Melanocytic nevi of trunk: Secondary | ICD-10-CM | POA: Diagnosis not present

## 2022-03-04 DIAGNOSIS — L02821 Furuncle of head [any part, except face]: Secondary | ICD-10-CM | POA: Diagnosis not present

## 2022-03-04 DIAGNOSIS — L570 Actinic keratosis: Secondary | ICD-10-CM | POA: Diagnosis not present

## 2022-03-04 DIAGNOSIS — L821 Other seborrheic keratosis: Secondary | ICD-10-CM | POA: Diagnosis not present

## 2022-03-06 DIAGNOSIS — H10013 Acute follicular conjunctivitis, bilateral: Secondary | ICD-10-CM | POA: Diagnosis not present

## 2022-06-25 IMAGING — MG DIGITAL SCREENING BILAT W/ TOMO W/ CAD
8 series · 9 of 24 positions shown · non-contrast
Comparison: Previous exam(s).

CLINICAL DATA: Screening.

EXAM:
DIGITAL SCREENING BILATERAL MAMMOGRAM WITH TOMO AND CAD

[R CC synth-2D]
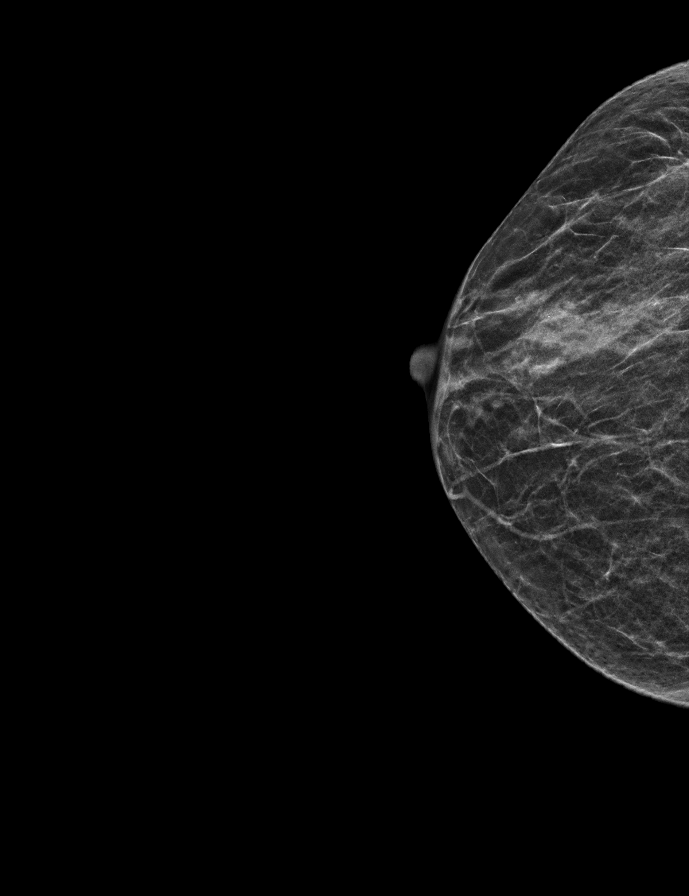

[L CC synth-2D]
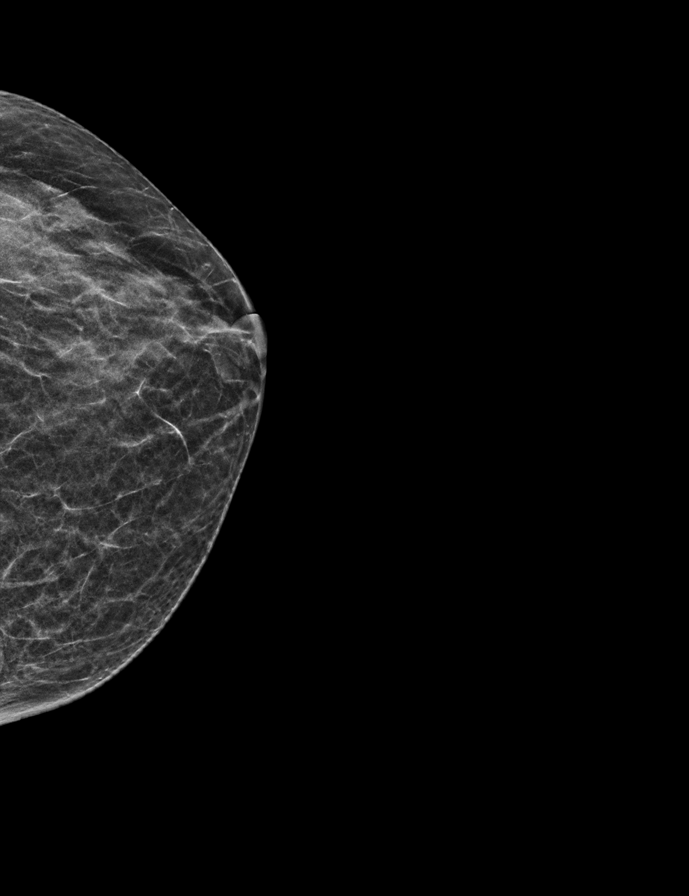

[L MLO synth-2D]
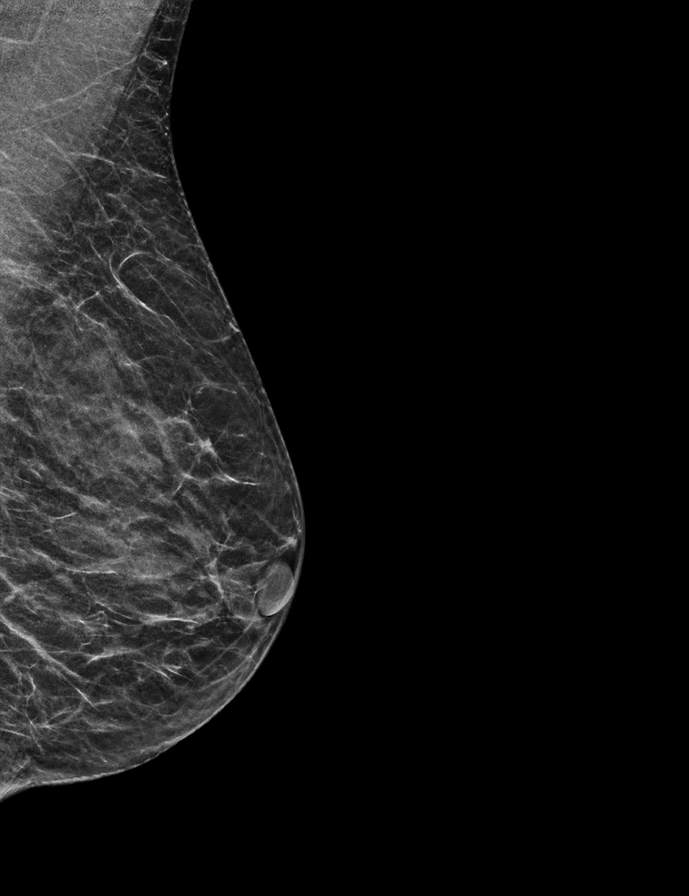

[R MLO synth-2D]
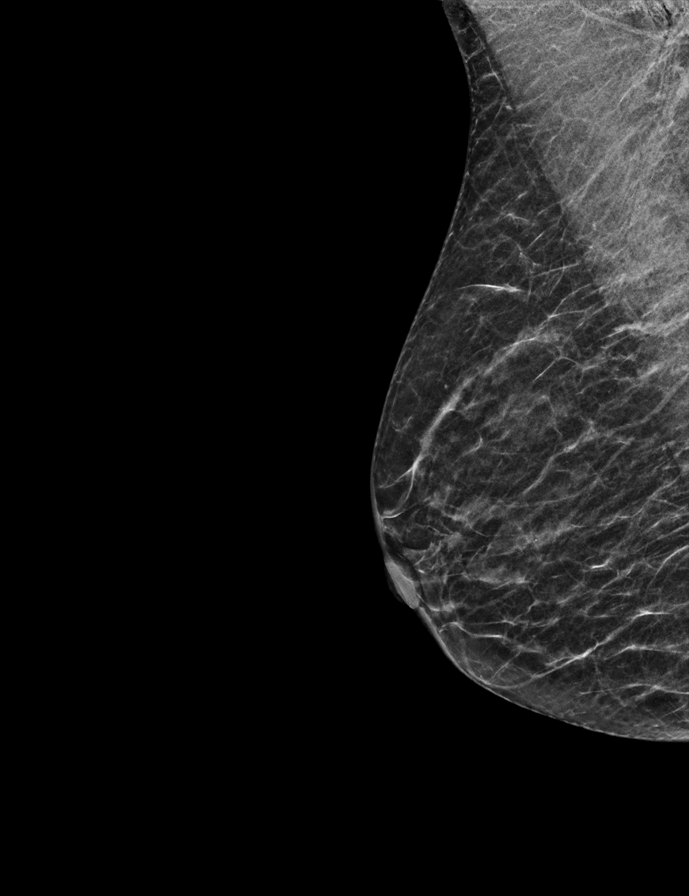

[L MLO tomo · 2 of 32 frames shown]
[frame 11/32]
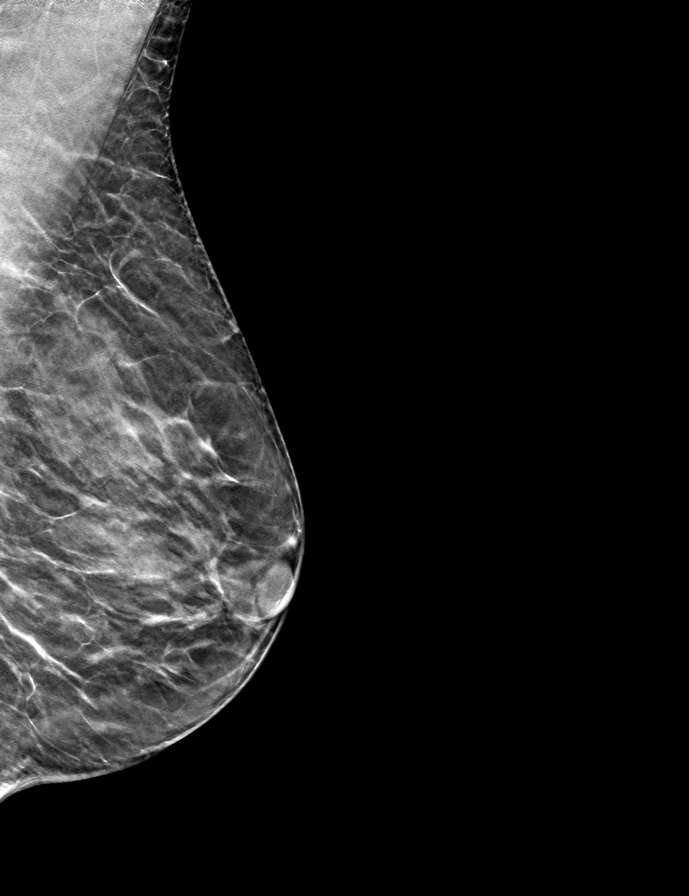
[frame 17/32]
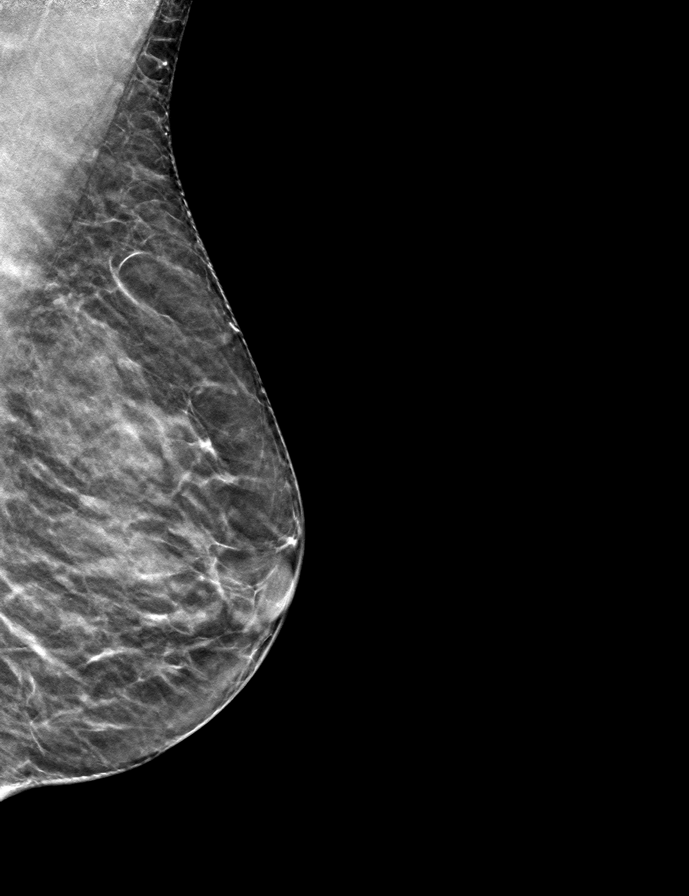

[R MLO tomo · tomo slice 17/32.0]
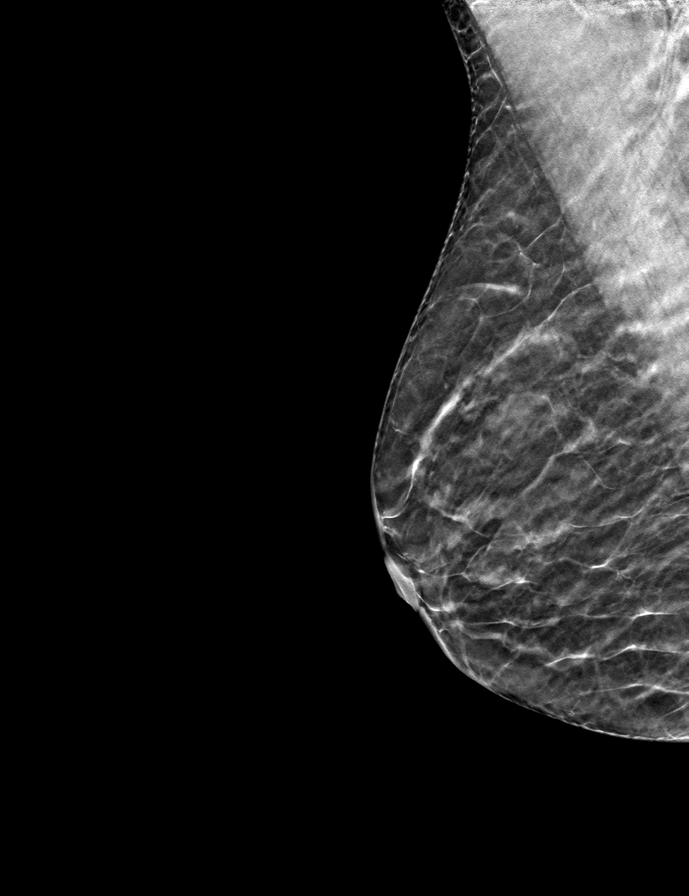

[L CC tomo · tomo slice 17/34.0]
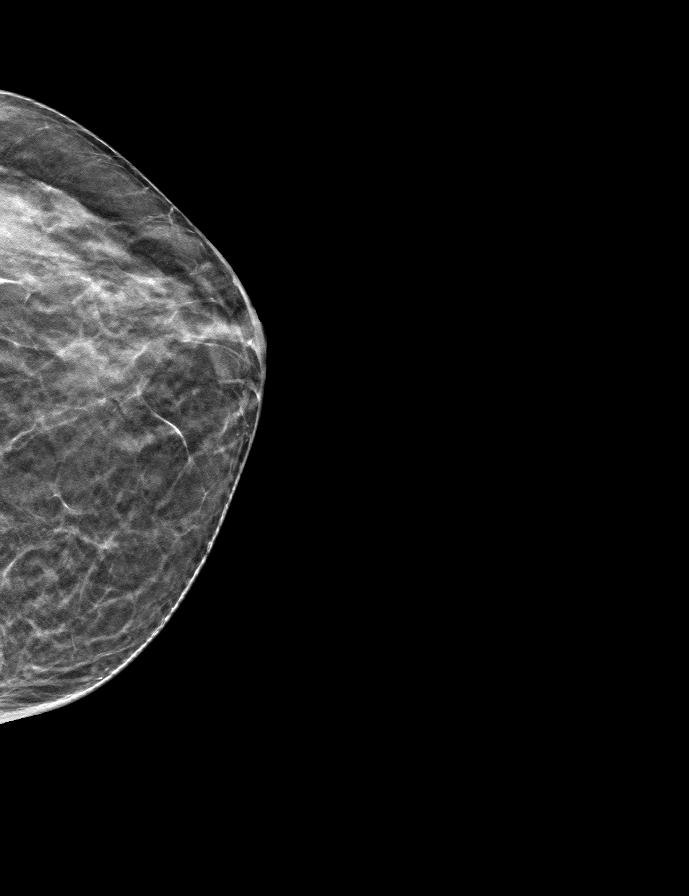

[R CC tomo · tomo slice 15/29.0]
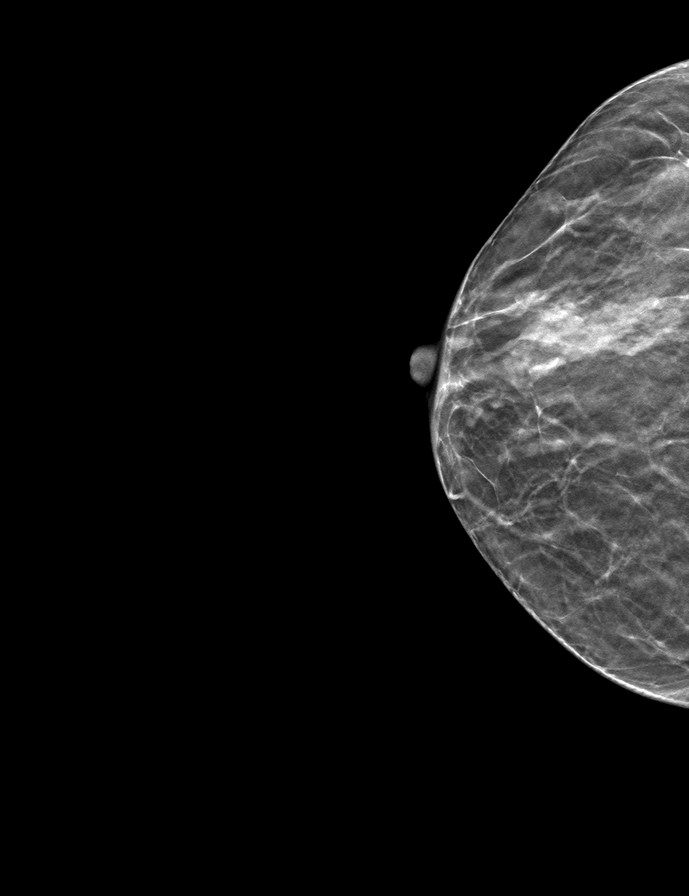

[9 of 24 positions shown; findings below may reference images not displayed]

ACR Breast Density Category c: The breast tissue is heterogeneously
dense, which may obscure small masses.
FINDINGS: There are no findings suspicious for malignancy. Images were
processed with CAD.
IMPRESSION: No mammographic evidence of malignancy. A result letter of this
screening mammogram will be mailed directly to the patient.

RECOMMENDATION:
Screening mammogram in one year. (Code:[id number]-[id number])

BI-RADS CATEGORY  1: Negative.

## 2022-07-15 ENCOUNTER — Ambulatory Visit (INDEPENDENT_AMBULATORY_CARE_PROVIDER_SITE_OTHER): Payer: Federal, State, Local not specified - PPO | Admitting: Family Medicine

## 2022-07-15 ENCOUNTER — Encounter: Payer: Self-pay | Admitting: Family Medicine

## 2022-07-15 DIAGNOSIS — U071 COVID-19: Secondary | ICD-10-CM

## 2022-07-15 MED ORDER — NIRMATRELVIR/RITONAVIR (PAXLOVID)TABLET: 3.0000 | ORAL_TABLET | Freq: Two times a day (BID) | ORAL | 0 refills | Status: AC

## 2022-07-15 NOTE — Progress Notes (Signed)
Virtual Visit via Telephone Note  I connected with Melissa Gilbert on 07/15/22 at 10:25 AM by telephone and verified that I am speaking with the correct person using two identifiers. Melissa Gilbert is currently located at home and nobody is currently with her during this visit. The provider, Gwenlyn Fudge, FNP is located in their office at time of visit.  I discussed the limitations, risks, security and privacy concerns of performing an evaluation and management service by telephone and the availability of in person appointments. I also discussed with the patient that there may be a patient responsible charge related to this service. The patient expressed understanding and agreed to proceed.  Subjective: PCP: Bennie Pierini, FNP  Chief Complaint  Patient presents with   Covid Positive   Patient complains of headache, runny nose, sore throat, and fever. Onset of symptoms was 2 days ago, gradually worsening since that time. She is drinking plenty of fluids. Evaluation to date: at home COVID test positive this morning. Treatment to date:  Advil, Vitamin C, Vitamin D, and Zinc . She does smoke.    ROS: Per HPI  Current Outpatient Medications:    Black Cohosh 40 MG CAPS, Take 40 mg by mouth 3 (three) times daily., Disp: , Rfl:    Cholecalciferol (VITAMIN D3) 2000 UNITS capsule, Take 2,000 Units by mouth daily., Disp: , Rfl:    fluticasone (FLONASE) 50 MCG/ACT nasal spray, Place 2 sprays into both nostrils daily., Disp: 16 g, Rfl: 6   levocetirizine (XYZAL) 5 MG tablet, Take 1 tablet (5 mg total) by mouth every evening., Disp: 30 tablet, Rfl: 5   levothyroxine (SYNTHROID) 125 MCG tablet, Take 1 tablet (125 mcg total) by mouth daily before breakfast., Disp: 90 tablet, Rfl: 1   Multiple Vitamin (MULTIVITAMIN) capsule, Take 1 capsule by mouth daily., Disp: , Rfl:    Omega-3 Fatty Acids (FISH OIL) 1000 MG CAPS, Take by mouth., Disp: , Rfl:    PREMARIN vaginal cream, INSERT (1)  APPLICATORFUL INTO THE VAGINA EVERY DAY -FOR VAGINAL USE-, Disp: 30 g, Rfl: 12   rosuvastatin (CRESTOR) 10 MG tablet, Take 1 tablet (10 mg total) by mouth daily., Disp: 90 tablet, Rfl: 1   S-Adenosylmethionine (SAM-E PO), Take by mouth., Disp: , Rfl:    triamcinolone cream (KENALOG) 0.1 %, Apply 1 application topically 2 (two) times daily., Disp: 30 g, Rfl: 1   varenicline (CHANTIX CONTINUING MONTH PAK) 1 MG tablet, Take 1 tablet (1 mg total) by mouth 2 (two) times daily., Disp: 60 tablet, Rfl: 2   Varenicline Tartrate, Starter, (CHANTIX STARTING MONTH PAK) 0.5 MG X 11 & 1 MG X 42 TBPK, Take 1 tablet by mouth 2 (two) times daily., Disp: 1 each, Rfl: 0  No Known Allergies Past Medical History:  Diagnosis Date   Allergy    Hyperlipidemia    Thyroid disease     Observations/Objective: A&O  No respiratory distress or wheezing audible over the phone Mood, judgement, and thought processes all WNL  Assessment and Plan: 1. COVID-19 Discussed symptom management. Medication reactions checked with the [facility] COVID-19 drug interactions; patient was advised to hold rosuvastatin while taking Paxlovid.  - nirmatrelvir/ritonavir EUA (PAXLOVID) 20 x 150 MG & 10 x 100MG  TABS; Take 3 tablets by mouth 2 (two) times daily for 5 days. (Take nirmatrelvir 150 mg two tablets twice daily for 5 days and ritonavir 100 mg one tablet twice daily for 5 days) Patient GFR is 80.  Dispense: 30  tablet; Refill: 0   Follow Up Instructions:  I discussed the assessment and treatment plan with the patient. The patient was provided an opportunity to ask questions and all were answered. The patient agreed with the plan and demonstrated an understanding of the instructions.   The patient was advised to call back or seek an in-person evaluation if the symptoms worsen or if the condition fails to improve as anticipated.  The above assessment and management plan was discussed with the patient. The patient  verbalized understanding of and has agreed to the management plan. Patient is aware to call the clinic if symptoms persist or worsen. Patient is aware when to return to the clinic for a follow-up visit. Patient educated on when it is appropriate to go to the emergency department.   Time call ended: 10:36 AM  I provided 11 minutes of non-face-to-face time during this encounter.  Deliah Boston, MSN, APRN, FNP-C [facility] 07/15/22

## 2022-07-16 ENCOUNTER — Other Ambulatory Visit: Payer: Self-pay

## 2022-07-16 ENCOUNTER — Emergency Department (HOSPITAL_COMMUNITY)
Admission: EM | Admit: 2022-07-16 | Discharge: 2022-07-16 | Disposition: A | Discharge status: Home / Self Care | Payer: Federal, State, Local not specified - PPO | Attending: Student | Admitting: Student

## 2022-07-16 ENCOUNTER — Telehealth: Payer: Self-pay | Admitting: Family Medicine

## 2022-07-16 ENCOUNTER — Encounter (HOSPITAL_COMMUNITY): Payer: Self-pay | Admitting: Emergency Medicine

## 2022-07-16 DIAGNOSIS — J029 Acute pharyngitis, unspecified: Secondary | ICD-10-CM

## 2022-07-16 DIAGNOSIS — U071 COVID-19: Secondary | ICD-10-CM | POA: Diagnosis not present

## 2022-07-16 LAB — GROUP A STREP BY PCR: Group A Strep by PCR: NOT DETECTED

## 2022-07-16 MED ORDER — KETOROLAC TROMETHAMINE 15 MG/ML IJ SOLN
15.0000 mg | Freq: Once | INTRAMUSCULAR | Status: AC
  Administered 2022-07-16: 15 mg via INTRAVENOUS
  Filled 2022-07-16: qty 1

## 2022-07-16 MED ORDER — LIDOCAINE VISCOUS HCL 2 % MT SOLN
15.0000 mL | Freq: Once | OROMUCOSAL | Status: AC
  Administered 2022-07-16: 15 mL via OROMUCOSAL
  Filled 2022-07-16: qty 15

## 2022-07-16 MED ORDER — LIDOCAINE VISCOUS HCL 2 % MT SOLN: 15.0000 mL | OROMUCOSAL | 0 refills | Status: AC | PRN

## 2022-07-16 MED ORDER — SODIUM CHLORIDE 0.9 % IV BOLUS
1000.0000 mL | Freq: Once | INTRAVENOUS | Status: AC
Start: 2022-07-16 — End: 2022-07-16
  Administered 2022-07-16: 1000 mL via INTRAVENOUS

## 2022-07-16 NOTE — Discharge Instructions (Signed)
Please use lidocaine for your throat pain.  Please continue taking Paxlovid.  This will take time.  Continue using warm salt water gargles in addition to tea.  Follow-up with your primary care doctor for further evaluation.  Return to the emergency room for any worsening symptoms.

## 2022-07-16 NOTE — ED Triage Notes (Signed)
Pt tested Covid + on 07/13/22, presents today for sore throat.

## 2022-07-16 NOTE — Telephone Encounter (Signed)
Patient is in the ER presently.

## 2022-07-16 NOTE — Telephone Encounter (Signed)
Pts husband called stating that pt had a visit with Deliah Boston yesterday and was prescribed some medicine for COVID. Says this morning pts throat is swollen and pt can barely talk. Wants to know if Bray[facility] El can send her something in for her throat.  Please advise and call husband.

## 2022-07-16 NOTE — Telephone Encounter (Signed)
Pts husband called back stating that he was still waiting to hear back from [place] or the nurse on if something could be sent in for pts throat.  Tried explaining to him that I did send a message to [place] and sent it as high priority but that Brayton El has been very busy seeing pts this morning so she hasnt had time to check her messages.   Husband said that pt decided to just go to the emergency room because at this point its getting harder for her to swallow.

## 2022-07-16 NOTE — ED Provider Notes (Signed)
[facility] EMERGENCY DEPARTMENT Provider Note   CSN: [id number] Arrival date & time: 07/16/22  1308     History Chief Complaint  Patient presents with   Sore Throat    Melissa Gilbert is a 66 y.o. female patient presents to the emergency department with a 1 day history of sore throat.  Patient was recently diagnosed with COVID on 07/13/2022.  Developed sore throat yesterday.  She has taken 1 days worth of Paxlovid.  Patient having trouble swallowing secondary to pain.  She is able to tolerate p.o. fluids however it is painful to swallow.  No trouble breathing.  No fever or chills.    Sore Throat       Home Medications Prior to Admission medications   Medication Sig Start Date End Date Taking? Authorizing Provider  lidocaine (XYLOCAINE) 2 % solution Use as directed 15 mLs in the mouth or throat as needed for mouth pain. 07/16/22  Yes Sanjiv Castorena M, PA-C  Black Cohosh 40 MG CAPS Take 40 mg by mouth 3 (three) times daily.    [provider]  Cholecalciferol (VITAMIN D3) 2000 UNITS capsule Take 2,000 Units by mouth daily.    [provider]  fluticasone (FLONASE) 50 MCG/ACT nasal spray Place 2 sprays into both nostrils daily. 02/06/20   Daphine Deutscher Mary-Margaret, FNP  levocetirizine (XYZAL) 5 MG tablet Take 1 tablet (5 mg total) by mouth every evening. 12/20/19   Bennie Pierini, FNP  levothyroxine (SYNTHROID) 125 MCG tablet Take 1 tablet (125 mcg total) by mouth daily before breakfast. 01/21/22   Daphine Deutscher Mary-Margaret, FNP  Multiple Vitamin (MULTIVITAMIN) capsule Take 1 capsule by mouth daily.    [provider]  nirmatrelvir/ritonavir EUA (PAXLOVID) 20 x 150 MG & 10 x 100MG  TABS Take 3 tablets by mouth 2 (two) times daily for 5 days. (Take nirmatrelvir 150 mg two tablets twice daily for 5 days and ritonavir 100 mg one tablet twice daily for 5 days) Patient GFR is 80. 07/15/22 07/20/22  07/22/22, FNP  Omega-3 Fatty Acids (FISH OIL) 1000 MG CAPS Take by  mouth.    [provider]  PREMARIN vaginal cream INSERT (1) APPLICATORFUL INTO THE VAGINA EVERY DAY -FOR VAGINAL USE- 06/28/21   06/30/21, Mary-Margaret, FNP  rosuvastatin (CRESTOR) 10 MG tablet Take 1 tablet (10 mg total) by mouth daily. 01/21/22   01/23/22, Mary-Margaret, FNP  S-Adenosylmethionine (SAM-E PO) Take by mouth.    [provider]  triamcinolone cream (KENALOG) 0.1 % Apply 1 application topically 2 (two) times daily. 07/23/21   07/25/21 Mary-Margaret, FNP  varenicline (CHANTIX CONTINUING MONTH PAK) 1 MG tablet Take 1 tablet (1 mg total) by mouth 2 (two) times daily. 01/21/22   01/23/22 Mary-Margaret, FNP  Varenicline Tartrate, Starter, (CHANTIX STARTING MONTH PAK) 0.5 MG X 11 & 1 MG X 42 TBPK Take 1 tablet by mouth 2 (two) times daily. 01/21/22   01/23/22, FNP      Allergies    Patient has no known allergies.    Review of Systems   Review of Systems  All other systems reviewed and are negative.   Physical Exam Updated Vital Signs BP (!) 133/59   Pulse 83   Temp 98.9 F (37.2 C) (Tympanic)   Resp 16   Ht 5' 9.5" (1.765 m)   Wt 78.3 kg   SpO2 96%   BMI 25.14 kg/m  Physical Exam Vitals and nursing note reviewed.  Constitutional:      Appearance: Normal appearance.  HENT:     Head: Normocephalic and atraumatic.     Mouth/Throat:     Mouth: Mucous membranes are moist.     Pharynx: Oropharynx is clear. Uvula midline. Posterior oropharyngeal erythema present. No pharyngeal swelling, oropharyngeal exudate or uvula swelling.  Eyes:     General:        Right eye: No discharge.        Left eye: No discharge.     Conjunctiva/sclera: Conjunctivae normal.  Pulmonary:     Effort: Pulmonary effort is normal.  Skin:    General: Skin is warm and dry.     Findings: No rash.  Neurological:     General: No focal deficit present.     Mental Status: She is alert.  Psychiatric:        Mood and Affect: Mood normal.        Behavior: Behavior normal.      ED Results / Procedures / Treatments   Labs (all labs ordered are listed, but only abnormal results are displayed) Labs Reviewed  GROUP A STREP BY PCR    EKG None  Radiology No results found.  Procedures Procedures    Medications Ordered in ED Medications  sodium chloride 0.9 % bolus 1,000 mL (1,000 mLs Intravenous New Bag/Given 07/16/22 1604)  lidocaine (XYLOCAINE) 2 % viscous mouth solution 15 mL (15 mLs Mouth/Throat Given 07/16/22 1555)  ketorolac (TORADOL) 15 MG/ML injection 15 mg (15 mg Intravenous Given 07/16/22 1601)    ED Course/ Medical Decision Making/ A&P Clinical Course as of 07/16/22 1736  Wed Jul 16, 2022  1653 Group A Strep by PCR Strep is negative. [CF]    Clinical Course User Index [CF] Teressa Lower, PA-C                           Medical Decision Making Melissa Gilbert is a 66 y.o. female patient who presents to the emergency department today for further evaluation of sore throat.  I have a low suspicion for RPA or PTA.  Patient requesting strep swab.  I think this is reasonable although I feel it is unlikely she has strep throat.  This is likely a sequelae from COVID.  We will give her viscous lidocaine in addition to Toradol and fluids as the patient has had significantly lack of p.o. intake.  We will plan to reassess.  Patient had some temporary relief with Xylocaine.  Patient feeling somewhat better with fluids.  I will prescribe her some Xylocaine viscus solution to go home with.  This is likely resulted from COVID.  Strep was negative.  This should resolve in the coming days.  I notified her of lab results.  I will have her follow-up with her primary care doctor for further evaluation.  Strict return precautions were discussed.  She is safe for discharge.  Amount and/or Complexity of Data Reviewed Labs:  Decision-making details documented in ED Course.  Risk Prescription drug management.   Final Clinical Impression(s) / ED  Diagnoses Final diagnoses:  Sore throat  COVID    Rx / DC Orders ED Discharge Orders          Ordered    lidocaine (XYLOCAINE) 2 % solution  As needed        07/16/22 1735              Teressa Lower, PA-C 07/16/22 1736    Jacalyn Lefevre, MD 07/16/22 2342

## 2022-07-22 ENCOUNTER — Encounter: Payer: Self-pay | Admitting: Nurse Practitioner

## 2022-07-22 ENCOUNTER — Ambulatory Visit: Payer: Federal, State, Local not specified - PPO | Admitting: Nurse Practitioner

## 2022-08-07 ENCOUNTER — Encounter: Payer: Self-pay | Admitting: Nurse Practitioner

## 2022-08-07 ENCOUNTER — Ambulatory Visit: Payer: Federal, State, Local not specified - PPO | Admitting: Nurse Practitioner

## 2022-08-07 VITALS — BP 124/70 | HR 71 | Temp 97.4°F | Resp 20 | Ht 69.0 in | Wt 176.0 lb

## 2022-08-07 DIAGNOSIS — Z6827 Body mass index (BMI) 27.0-27.9, adult: Secondary | ICD-10-CM

## 2022-08-07 DIAGNOSIS — K219 Gastro-esophageal reflux disease without esophagitis: Secondary | ICD-10-CM | POA: Diagnosis not present

## 2022-08-07 DIAGNOSIS — E034 Atrophy of thyroid (acquired): Secondary | ICD-10-CM | POA: Diagnosis not present

## 2022-08-07 DIAGNOSIS — J301 Allergic rhinitis due to pollen: Secondary | ICD-10-CM

## 2022-08-07 DIAGNOSIS — E782 Mixed hyperlipidemia: Secondary | ICD-10-CM | POA: Diagnosis not present

## 2022-08-07 MED ORDER — ROSUVASTATIN CALCIUM 10 MG PO TABS: 10.0000 mg | ORAL_TABLET | Freq: Every day | ORAL | 1 refills | Status: DC

## 2022-08-07 MED ORDER — FLUTICASONE PROPIONATE 50 MCG/ACT NA SUSP: 2.0000 | Freq: Every day | NASAL | 6 refills | Status: AC

## 2022-08-07 MED ORDER — LEVOCETIRIZINE DIHYDROCHLORIDE 5 MG PO TABS: 5.0000 mg | ORAL_TABLET | Freq: Every evening | ORAL | 5 refills | Status: AC

## 2022-08-07 MED ORDER — LEVOTHYROXINE SODIUM 125 MCG PO TABS
125.0000 ug | ORAL_TABLET | Freq: Every day | ORAL | 1 refills | Status: DC
Start: 2022-08-07 — End: 2023-02-05

## 2022-08-07 NOTE — Patient Instructions (Signed)

## 2022-08-07 NOTE — Progress Notes (Signed)
Subjective:    Patient ID: Melissa Gilbert, female    DOB: 09-Feb-1956, 66 y.o.   MRN: [id number]   Chief Complaint: medical management of chronic issues     HPI:  Melissa Gilbert is a 66 y.o. who identifies as a female who was assigned female at birth.   Social history: Lives with: husband a 2 sons Work history: self employeed as Librarian, academic in today for follow up of the following chronic medical issues:  1. Mixed hyperlipidemia Does not really watch diet and does no dedicated exercise. She does stay very active. Lab Results  Component Value Date   CHOL 212 (H) 01/21/2022   HDL 91 01/21/2022   LDLCALC 105 (H) 01/21/2022   TRIG 94 01/21/2022   CHOLHDL 2.3 01/21/2022   The 10-year ASCVD risk score (Arnett DK, et al., 2019) is: 6.5%    2. Hypothyroidism due to acquired atrophy of thyroid No problems that she is aware of. Lab Results  Component Value Date   TSH 2.100 01/21/2022     3. Gastroesophageal reflux disease without esophagitis Is on no prescription meds. Takes OTC meds as needed  4. BMI 27.0-27.9,adult Weight is up 4 lbs. Wt Readings from Last 3 Encounters:  08/07/22 176 lb (79.8 kg)  07/16/22 172 lb 11.2 oz (78.3 kg)  01/21/22 171 lb 6 oz (77.7 kg)   BMI Readings from Last 3 Encounters:  08/07/22 25.99 kg/m  07/16/22 25.14 kg/m  01/21/22 24.94 kg/m     New complaints: None today  No Known Allergies Outpatient Encounter Medications as of 08/07/2022  Medication Sig   Black Cohosh 40 MG CAPS Take 40 mg by mouth 3 (three) times daily.   Cholecalciferol (VITAMIN D3) 2000 UNITS capsule Take 2,000 Units by mouth daily.   fluticasone (FLONASE) 50 MCG/ACT nasal spray Place 2 sprays into both nostrils daily.   levocetirizine (XYZAL) 5 MG tablet Take 1 tablet (5 mg total) by mouth every evening.   levothyroxine (SYNTHROID) 125 MCG tablet Take 1 tablet (125 mcg total) by mouth daily before breakfast.   lidocaine (XYLOCAINE) 2 % solution Use as  directed 15 mLs in the mouth or throat as needed for mouth pain.   Multiple Vitamin (MULTIVITAMIN) capsule Take 1 capsule by mouth daily.   Omega-3 Fatty Acids (FISH OIL) 1000 MG CAPS Take by mouth.   PREMARIN vaginal cream INSERT (1) APPLICATORFUL INTO THE VAGINA EVERY DAY -FOR VAGINAL USE-   rosuvastatin (CRESTOR) 10 MG tablet Take 1 tablet (10 mg total) by mouth daily.   S-Adenosylmethionine (SAM-E PO) Take by mouth.   triamcinolone cream (KENALOG) 0.1 % Apply 1 application topically 2 (two) times daily.   varenicline (CHANTIX CONTINUING MONTH PAK) 1 MG tablet Take 1 tablet (1 mg total) by mouth 2 (two) times daily.   Varenicline Tartrate, Starter, (CHANTIX STARTING MONTH PAK) 0.5 MG X 11 & 1 MG X 42 TBPK Take 1 tablet by mouth 2 (two) times daily.   No facility-administered encounter medications on file as of 08/07/2022.    Past Surgical History:  Procedure Laterality Date   EYE SURGERY     HAMMER TOE SURGERY     RIGHT EYE SURGERY     TUBAL LIGATION      Family History  Problem Relation Age of Onset   Alzheimer's disease Mother    Hypothyroidism Mother    Heart disease Father    Cancer Father        prostate cancer  Hyperlipidemia Father    Hypothyroidism Sister    Breast cancer Neg Hx       Controlled substance contract: n/a     Review of Systems  Constitutional:  Negative for diaphoresis.  Eyes:  Negative for pain.  Respiratory:  Negative for shortness of breath.   Cardiovascular:  Negative for chest pain, palpitations and leg swelling.  Gastrointestinal:  Negative for abdominal pain.  Endocrine: Negative for polydipsia.  Skin:  Negative for rash.  Neurological:  Negative for dizziness, weakness and headaches.  Hematological:  Does not bruise/bleed easily.  All other systems reviewed and are negative.      Objective:   Physical Exam Vitals and nursing note reviewed.  Constitutional:      General: She is not in acute distress.    Appearance: Normal  appearance. She is well-developed.  HENT:     Head: Normocephalic.     Right Ear: Tympanic membrane normal.     Left Ear: Tympanic membrane normal.     Nose: Nose normal.     Mouth/Throat:     Mouth: Mucous membranes are moist.  Eyes:     Pupils: Pupils are equal, round, and reactive to light.  Neck:     Vascular: No carotid bruit or JVD.  Cardiovascular:     Rate and Rhythm: Normal rate and regular rhythm.     Heart sounds: Normal heart sounds.  Pulmonary:     Effort: Pulmonary effort is normal. No respiratory distress.     Breath sounds: Normal breath sounds. No wheezing or rales.  Chest:     Chest wall: No tenderness.  Abdominal:     General: Bowel sounds are normal. There is no distension or abdominal bruit.     Palpations: Abdomen is soft. There is no hepatomegaly, splenomegaly, mass or pulsatile mass.     Tenderness: There is no abdominal tenderness.  Musculoskeletal:        General: Normal range of motion.     Cervical back: Normal range of motion and neck supple.  Lymphadenopathy:     Cervical: No cervical adenopathy.  Skin:    General: Skin is warm and dry.  Neurological:     Mental Status: She is alert and oriented to person, place, and time.     Deep Tendon Reflexes: Reflexes are normal and symmetric.  Psychiatric:        Behavior: Behavior normal.        Thought Content: Thought content normal.        Judgment: Judgment normal.    BP 124/70   Pulse 71   Temp (!) 97.4 F (36.3 C) (Temporal)   Resp 20   Ht 5' 9"  (1.753 m)   Wt 176 lb (79.8 kg)   SpO2 95%   BMI 25.99 kg/m         Assessment & Plan:   Rudene Christians in today with chief complaint of Medical Management of Chronic Issues   1. Mixed hyperlipidemia Low fat diet - rosuvastatin (CRESTOR) 10 MG tablet; Take 1 tablet (10 mg total) by mouth daily.  Dispense: 90 tablet; Refill: 1 - CBC with Differential/Platelet - CMP14+EGFR - Lipid panel  2. Hypothyroidism due to acquired atrophy of  thyroid Labs pending - levothyroxine (SYNTHROID) 125 MCG tablet; Take 1 tablet (125 mcg total) by mouth daily before breakfast.  Dispense: 90 tablet; Refill: 1 - Thyroid Panel With TSH  3. Gastroesophageal reflux disease without esophagitis Avoid spicy foods Do not eat 2 hours  prior to bedtime   4. BMI 27.0-27.9,adult Discussed diet and exercise for person with BMI >25 Will recheck weight in 3-6 months   5. Seasonal allergic rhinitis due to pollen - levocetirizine (XYZAL) 5 MG tablet; Take 1 tablet (5 mg total) by mouth every evening.  Dispense: 30 tablet; Refill: 5 - fluticasone (FLONASE) 50 MCG/ACT nasal spray; Place 2 sprays into both nostrils daily.  Dispense: 16 g; Refill: 6    The above assessment and management plan was discussed with the patient. The patient verbalized understanding of and has agreed to the management plan. Patient is aware to call the clinic if symptoms persist or worsen. Patient is aware when to return to the clinic for a follow-up visit. Patient educated on when it is appropriate to go to the emergency department.   Mary-Margaret Hassell Done, FNP

## 2022-08-08 LAB — THYROID PANEL WITH TSH
Free Thyroxine Index: 2.4 (ref 1.2–4.9)
T3 Uptake Ratio: 24 % (ref 24–39)
T4, Total: 9.8 ug/dL (ref 4.5–12.0)
TSH: 0.496 u[IU]/mL (ref 0.450–4.500)

## 2022-08-08 LAB — CMP14+EGFR
ALT: 14 IU/L (ref 0–32)
AST: 25 IU/L (ref 0–40)
Albumin/Globulin Ratio: 2.7 — ABNORMAL HIGH (ref 1.2–2.2)
Albumin: 4.9 g/dL (ref 3.9–4.9)
Alkaline Phosphatase: 69 IU/L (ref 44–121)
BUN/Creatinine Ratio: 16 (ref 12–28)
BUN: 16 mg/dL (ref 8–27)
Bilirubin Total: 0.2 mg/dL (ref 0.0–1.2)
CO2: 22 mmol/L (ref 20–29)
Calcium: 9.4 mg/dL (ref 8.7–10.3)
Chloride: 102 mmol/L (ref 96–106)
Creatinine, Ser: 0.98 mg/dL (ref 0.57–1.00)
Globulin, Total: 1.8 g/dL (ref 1.5–4.5)
Glucose: 98 mg/dL (ref 70–99)
Potassium: 4.4 mmol/L (ref 3.5–5.2)
Sodium: 142 mmol/L (ref 134–144)
Total Protein: 6.7 g/dL (ref 6.0–8.5)
eGFR: 65 mL/min/{1.73_m2} (ref >59)

## 2022-08-08 LAB — CBC WITH DIFFERENTIAL/PLATELET
Basophils Absolute: 0 10*3/uL (ref 0.0–0.2)
Basos: 1 %
EOS (ABSOLUTE): 0 10*3/uL (ref 0.0–0.4)
Eos: 1 %
Hematocrit: 34.4 % (ref 34.0–46.6)
Hemoglobin: 12 g/dL (ref 11.1–15.9)
Immature Grans (Abs): 0 10*3/uL (ref 0.0–0.1)
Immature Granulocytes: 0 %
Lymphocytes Absolute: 2 10*3/uL (ref 0.7–3.1)
Lymphs: 36 %
MCH: 31.7 pg (ref 26.6–33.0)
MCHC: 34.9 g/dL (ref 31.5–35.7)
MCV: 91 fL (ref 79–97)
Monocytes Absolute: 0.3 10*3/uL (ref 0.1–0.9)
Monocytes: 6 %
Neutrophils Absolute: 3.1 10*3/uL (ref 1.4–7.0)
Neutrophils: 56 %
Platelets: 261 10*3/uL (ref 150–450)
RBC: 3.78 x10E6/uL (ref 3.77–5.28)
RDW: 12.5 % (ref 11.7–15.4)
WBC: 5.5 10*3/uL (ref 3.4–10.8)

## 2022-08-08 LAB — LIPID PANEL
Chol/HDL Ratio: 2 ratio (ref 0.0–4.4)
Cholesterol, Total: 199 mg/dL (ref 100–199)
HDL: 98 mg/dL (ref >39)
LDL Chol Calc (NIH): 81 mg/dL (ref 0–99)
Triglycerides: 116 mg/dL (ref 0–149)
VLDL Cholesterol Cal: 20 mg/dL (ref 5–40)

## 2022-08-15 ENCOUNTER — Other Ambulatory Visit: Payer: Self-pay | Admitting: Nurse Practitioner

## 2022-08-15 DIAGNOSIS — Z1231 Encounter for screening mammogram for malignant neoplasm of breast: Secondary | ICD-10-CM

## 2022-09-12 DIAGNOSIS — Z124 Encounter for screening for malignant neoplasm of cervix: Secondary | ICD-10-CM | POA: Diagnosis not present

## 2022-09-12 DIAGNOSIS — Z6826 Body mass index (BMI) 26.0-26.9, adult: Secondary | ICD-10-CM | POA: Diagnosis not present

## 2022-09-12 DIAGNOSIS — Z01419 Encounter for gynecological examination (general) (routine) without abnormal findings: Secondary | ICD-10-CM | POA: Diagnosis not present

## 2022-09-15 DIAGNOSIS — L814 Other melanin hyperpigmentation: Secondary | ICD-10-CM | POA: Diagnosis not present

## 2022-09-15 DIAGNOSIS — D225 Melanocytic nevi of trunk: Secondary | ICD-10-CM | POA: Diagnosis not present

## 2022-09-15 DIAGNOSIS — L821 Other seborrheic keratosis: Secondary | ICD-10-CM | POA: Diagnosis not present

## 2022-09-15 DIAGNOSIS — L570 Actinic keratosis: Secondary | ICD-10-CM | POA: Diagnosis not present

## 2022-09-18 DIAGNOSIS — H16223 Keratoconjunctivitis sicca, not specified as Sjogren's, bilateral: Secondary | ICD-10-CM | POA: Diagnosis not present

## 2022-09-18 DIAGNOSIS — H0288B Meibomian gland dysfunction left eye, upper and lower eyelids: Secondary | ICD-10-CM | POA: Diagnosis not present

## 2022-09-29 ENCOUNTER — Ambulatory Visit
Admission: RE | Admit: 2022-09-29 | Discharge: 2022-09-29 | Disposition: A | Discharge status: Home / Self Care | Payer: Federal, State, Local not specified - PPO | Source: Ambulatory Visit | Attending: Nurse Practitioner | Admitting: Nurse Practitioner

## 2022-09-29 DIAGNOSIS — Z1231 Encounter for screening mammogram for malignant neoplasm of breast: Secondary | ICD-10-CM | POA: Diagnosis not present

## 2022-10-09 DIAGNOSIS — H0288A Meibomian gland dysfunction right eye, upper and lower eyelids: Secondary | ICD-10-CM | POA: Diagnosis not present

## 2022-10-09 DIAGNOSIS — H16223 Keratoconjunctivitis sicca, not specified as Sjogren's, bilateral: Secondary | ICD-10-CM | POA: Diagnosis not present

## 2022-11-13 DIAGNOSIS — H16223 Keratoconjunctivitis sicca, not specified as Sjogren's, bilateral: Secondary | ICD-10-CM | POA: Diagnosis not present

## 2022-11-20 DIAGNOSIS — R059 Cough, unspecified: Secondary | ICD-10-CM | POA: Diagnosis not present

## 2022-11-20 DIAGNOSIS — J111 Influenza due to unidentified influenza virus with other respiratory manifestations: Secondary | ICD-10-CM | POA: Diagnosis not present

## 2022-11-20 DIAGNOSIS — R509 Fever, unspecified: Secondary | ICD-10-CM | POA: Diagnosis not present

## 2023-01-15 ENCOUNTER — Other Ambulatory Visit: Payer: Self-pay | Admitting: Nurse Practitioner

## 2023-02-04 NOTE — Progress Notes (Signed)
Subjective:    Patient ID: Melissa Gilbert, female    DOB: 1956-11-04, 67 y.o.   MRN: [id number]   Chief Complaint:  annual physical  HPI:  Melissa Gilbert is a 67 y.o. who identifies as a female who was assigned female at birth.   Social history: Lives with: husband Work history: painter   Comes in today for follow up of the following chronic medical issues:  1. Mixed hyperlipidemia Does try  to watch diet. Does not do any dedicated exercise.; Lab Results  Component Value Date   CHOL 199 08/07/2022   HDL 98 08/07/2022   LDLCALC 81 08/07/2022   TRIG 116 08/07/2022   CHOLHDL 2.0 08/07/2022     2. Hypothyroidism due to acquired atrophy of thyroid No issues that she is aware of. Lab Results  Component Value Date   TSH 0.496 08/07/2022     3. Gastroesophageal reflux disease without esophagitis Takes OTC meds when needed  4. BMI 27.0-27.9,adult No recent weight changes   New complaints: Patient has been having low intermittent back pain ( hx of spondylosis ) and intermittent knee pain. She does not want to do anything about it right now. Just wants documentation in case wants something done.  Allergies  Allergen Reactions   Paxlovid [Nirmatrelvir-Ritonavir]    Outpatient Encounter Medications as of 02/05/2023  Medication Sig   Black Cohosh 40 MG CAPS Take 40 mg by mouth 3 (three) times daily. (Patient not taking: Reported on 08/07/2022)   Cholecalciferol (VITAMIN D3) 2000 UNITS capsule Take 2,000 Units by mouth daily.   conjugated estrogens (PREMARIN) vaginal cream INSERT (1) APPLICATORFUL INTO THE VAGINA EVERY DAY -FOR VAGINAL USE-   fluticasone (FLONASE) 50 MCG/ACT nasal spray Place 2 sprays into both nostrils daily.   levocetirizine (XYZAL) 5 MG tablet Take 1 tablet (5 mg total) by mouth every evening.   levothyroxine (SYNTHROID) 125 MCG tablet Take 1 tablet (125 mcg total) by mouth daily before breakfast.   lidocaine (XYLOCAINE) 2 % solution Use as directed 15  mLs in the mouth or throat as needed for mouth pain. (Patient not taking: Reported on 08/07/2022)   Multiple Vitamin (MULTIVITAMIN) capsule Take 1 capsule by mouth daily. (Patient not taking: Reported on 08/07/2022)   Omega-3 Fatty Acids (FISH OIL) 1000 MG CAPS Take by mouth. (Patient not taking: Reported on 08/07/2022)   rosuvastatin (CRESTOR) 10 MG tablet Take 1 tablet (10 mg total) by mouth daily.   S-Adenosylmethionine (SAM-E PO) Take by mouth. (Patient not taking: Reported on 08/07/2022)   triamcinolone cream (KENALOG) 0.1 % Apply 1 application topically 2 (two) times daily. (Patient not taking: Reported on 08/07/2022)   varenicline (CHANTIX CONTINUING MONTH PAK) 1 MG tablet Take 1 tablet (1 mg total) by mouth 2 (two) times daily. (Patient not taking: Reported on 08/07/2022)   Varenicline Tartrate, Starter, (CHANTIX STARTING MONTH PAK) 0.5 MG X 11 & 1 MG X 42 TBPK Take 1 tablet by mouth 2 (two) times daily. (Patient not taking: Reported on 08/07/2022)   No facility-administered encounter medications on file as of 02/05/2023.    Past Surgical History:  Procedure Laterality Date   EYE SURGERY     HAMMER TOE SURGERY     RIGHT EYE SURGERY     TUBAL LIGATION      Family History  Problem Relation Age of Onset   Alzheimer's disease Mother    Hypothyroidism Mother    Heart disease Father    Cancer Father  prostate cancer   Hyperlipidemia Father    Hypothyroidism Sister    Breast cancer Neg Hx       Controlled substance contract: n/a     Review of Systems  Constitutional:  Negative for diaphoresis.  Eyes:  Negative for pain.  Respiratory:  Negative for shortness of breath.   Cardiovascular:  Negative for chest pain, palpitations and leg swelling.  Gastrointestinal:  Negative for abdominal pain.  Endocrine: Negative for polydipsia.  Skin:  Negative for rash.  Neurological:  Negative for dizziness, weakness and headaches.  Hematological:  Does not bruise/bleed easily.  All other  systems reviewed and are negative.      Objective:   Physical Exam Vitals and nursing note reviewed.  Constitutional:      General: She is not in acute distress.    Appearance: Normal appearance. She is well-developed.  HENT:     Head: Normocephalic.     Right Ear: Tympanic membrane normal.     Left Ear: Tympanic membrane normal.     Nose: Nose normal.     Mouth/Throat:     Mouth: Mucous membranes are moist.  Eyes:     Pupils: Pupils are equal, round, and reactive to light.  Neck:     Vascular: No carotid bruit or JVD.  Cardiovascular:     Rate and Rhythm: Normal rate and regular rhythm.     Heart sounds: Normal heart sounds.  Pulmonary:     Effort: Pulmonary effort is normal. No respiratory distress.     Breath sounds: Normal breath sounds. No wheezing or rales.  Chest:     Chest wall: No tenderness.  Abdominal:     General: Bowel sounds are normal. There is no distension or abdominal bruit.     Palpations: Abdomen is soft. There is no hepatomegaly, splenomegaly, mass or pulsatile mass.     Tenderness: There is no abdominal tenderness.  Musculoskeletal:        General: Normal range of motion.     Cervical back: Normal range of motion and neck supple.     Comments: FROM of  lumbar spine without pain today (-) SLR bil FROM of right knee without pain.  Lymphadenopathy:     Cervical: No cervical adenopathy.  Skin:    General: Skin is warm and dry.  Neurological:     Mental Status: She is alert and oriented to person, place, and time.     Deep Tendon Reflexes: Reflexes are normal and symmetric.  Psychiatric:        Behavior: Behavior normal.        Thought Content: Thought content normal.        Judgment: Judgment normal.     BP 115/79   Pulse 84   Temp (!) 97 F (36.1 C) (Oral)   Resp 20   Ht 5\' 9"  (1.753 m)   Wt 173 lb (78.5 kg)   SpO2 98%   BMI 25.55 kg/m   EKG- Brendolyn Patty, FNP      Assessment & Plan:   Melissa Gilbert comes in today  with chief complaint of Medical Management of Chronic Issues   Diagnosis and orders addressed:  1. Annual physical exam - DG Chest 2 View - EKG 12-Lead  2. Mixed hyperlipidemia Low fat diet - CBC with Differential/Platelet - CMP14+EGFR - Lipid panel - Thyroid Panel With TSH  3. Hypothyroidism due to acquired atrophy of thyroid Labs pending  4. Gastroesophageal reflux disease without esophagitis Avoid spicy foods  Do not eat 2 hours prior to bedtime   5. BMI 27.0-27.9,adult Discussed diet and exercise for person with BMI >25 Will recheck weight in 3-6 months    Labs pending Health Maintenance reviewed Diet and exercise encouraged  Follow up plan: 6 months   Mary-Margaret Daphine Deutscher, FNP

## 2023-02-05 ENCOUNTER — Encounter: Payer: Self-pay | Admitting: Nurse Practitioner

## 2023-02-05 ENCOUNTER — Ambulatory Visit (INDEPENDENT_AMBULATORY_CARE_PROVIDER_SITE_OTHER): Payer: Federal, State, Local not specified - PPO

## 2023-02-05 ENCOUNTER — Ambulatory Visit: Payer: Federal, State, Local not specified - PPO | Admitting: Nurse Practitioner

## 2023-02-05 VITALS — BP 115/79 | HR 84 | Temp 97.0°F | Resp 20 | Ht 69.0 in | Wt 173.0 lb

## 2023-02-05 DIAGNOSIS — Z0000 Encounter for general adult medical examination without abnormal findings: Secondary | ICD-10-CM

## 2023-02-05 DIAGNOSIS — E785 Hyperlipidemia, unspecified: Secondary | ICD-10-CM

## 2023-02-05 DIAGNOSIS — K219 Gastro-esophageal reflux disease without esophagitis: Secondary | ICD-10-CM

## 2023-02-05 DIAGNOSIS — E782 Mixed hyperlipidemia: Secondary | ICD-10-CM

## 2023-02-05 DIAGNOSIS — E034 Atrophy of thyroid (acquired): Secondary | ICD-10-CM

## 2023-02-05 DIAGNOSIS — Z0389 Encounter for observation for other suspected diseases and conditions ruled out: Secondary | ICD-10-CM | POA: Diagnosis not present

## 2023-02-05 DIAGNOSIS — Z6827 Body mass index (BMI) 27.0-27.9, adult: Secondary | ICD-10-CM

## 2023-02-05 DIAGNOSIS — Z0001 Encounter for general adult medical examination with abnormal findings: Secondary | ICD-10-CM

## 2023-02-05 MED ORDER — LEVOTHYROXINE SODIUM 125 MCG PO TABS: 125.0000 ug | ORAL_TABLET | Freq: Every day | ORAL | 1 refills | Status: DC

## 2023-02-05 MED ORDER — ROSUVASTATIN CALCIUM 10 MG PO TABS: 10.0000 mg | ORAL_TABLET | Freq: Every day | ORAL | 1 refills | Status: DC

## 2023-02-06 LAB — CBC WITH DIFFERENTIAL/PLATELET
Basophils Absolute: 0.1 10*3/uL (ref 0.0–0.2)
Basos: 1 %
EOS (ABSOLUTE): 0.1 10*3/uL (ref 0.0–0.4)
Eos: 1 %
Hematocrit: 37.4 % (ref 34.0–46.6)
Hemoglobin: 12.3 g/dL (ref 11.1–15.9)
Immature Grans (Abs): 0 10*3/uL (ref 0.0–0.1)
Immature Granulocytes: 0 %
Lymphocytes Absolute: 1.8 10*3/uL (ref 0.7–3.1)
Lymphs: 34 %
MCH: 30.8 pg (ref 26.6–33.0)
MCHC: 32.9 g/dL (ref 31.5–35.7)
MCV: 94 fL (ref 79–97)
Monocytes Absolute: 0.3 10*3/uL (ref 0.1–0.9)
Monocytes: 7 %
Neutrophils Absolute: 3 10*3/uL (ref 1.4–7.0)
Neutrophils: 57 %
Platelets: 237 10*3/uL (ref 150–450)
RBC: 4 x10E6/uL (ref 3.77–5.28)
RDW: 13.7 % (ref 11.7–15.4)
WBC: 5.2 10*3/uL (ref 3.4–10.8)

## 2023-02-06 LAB — CMP14+EGFR
ALT: 18 IU/L (ref 0–32)
AST: 30 IU/L (ref 0–40)
Albumin/Globulin Ratio: 2.8 — ABNORMAL HIGH (ref 1.2–2.2)
Albumin: 5 g/dL — ABNORMAL HIGH (ref 3.9–4.9)
Alkaline Phosphatase: 55 IU/L (ref 44–121)
BUN/Creatinine Ratio: 15 (ref 12–28)
BUN: 15 mg/dL (ref 8–27)
Bilirubin Total: 0.2 mg/dL (ref 0.0–1.2)
CO2: 25 mmol/L (ref 20–29)
Calcium: 10 mg/dL (ref 8.7–10.3)
Chloride: 100 mmol/L (ref 96–106)
Creatinine, Ser: 0.97 mg/dL (ref 0.57–1.00)
Globulin, Total: 1.8 g/dL (ref 1.5–4.5)
Glucose: 92 mg/dL (ref 70–99)
Potassium: 4.3 mmol/L (ref 3.5–5.2)
Sodium: 140 mmol/L (ref 134–144)
Total Protein: 6.8 g/dL (ref 6.0–8.5)
eGFR: 65 mL/min/{1.73_m2} (ref >59)

## 2023-02-06 LAB — THYROID PANEL WITH TSH
Free Thyroxine Index: 3 (ref 1.2–4.9)
T3 Uptake Ratio: 27 % (ref 24–39)
T4, Total: 11 ug/dL (ref 4.5–12.0)
TSH: 2.02 u[IU]/mL (ref 0.450–4.500)

## 2023-02-06 LAB — LIPID PANEL
Chol/HDL Ratio: 2.1 ratio (ref 0.0–4.4)
Cholesterol, Total: 211 mg/dL — ABNORMAL HIGH (ref 100–199)
HDL: 102 mg/dL (ref >39)
LDL Chol Calc (NIH): 97 mg/dL (ref 0–99)
Triglycerides: 65 mg/dL (ref 0–149)
VLDL Cholesterol Cal: 12 mg/dL (ref 5–40)

## 2023-02-24 ENCOUNTER — Ambulatory Visit (INDEPENDENT_AMBULATORY_CARE_PROVIDER_SITE_OTHER): Payer: Federal, State, Local not specified - PPO | Admitting: Family Medicine

## 2023-02-24 ENCOUNTER — Encounter: Payer: Self-pay | Admitting: Family Medicine

## 2023-02-24 VITALS — BP 125/74 | HR 70 | Temp 97.1°F | Ht 69.0 in | Wt 171.2 lb

## 2023-02-24 DIAGNOSIS — L03012 Cellulitis of left finger: Secondary | ICD-10-CM | POA: Diagnosis not present

## 2023-02-24 DIAGNOSIS — T148XXA Other injury of unspecified body region, initial encounter: Secondary | ICD-10-CM

## 2023-02-24 DIAGNOSIS — S60512A Abrasion of left hand, initial encounter: Secondary | ICD-10-CM

## 2023-02-24 MED ORDER — CEPHALEXIN 500 MG PO CAPS: 500.0000 mg | ORAL_CAPSULE | Freq: Four times a day (QID) | ORAL | 0 refills | Status: AC

## 2023-02-24 NOTE — Progress Notes (Signed)
Subjective:  Patient ID: Melissa Gilbert, female    DOB: [birth year], 67 y.o.   MRN: [id number]  Patient Care Team: Chevis Pretty, Leslie as PCP - General (Nurse Practitioner)   Chief Complaint:  thorn  (Patient states she was poked by thorns on her left hand x 3 weeks )   HPI: Melissa Gilbert is a 67 y.o. female presenting on 02/24/2023 for thorn  (Patient states she was poked by thorns on her left hand x 3 weeks )   Pt presents today for evaluation of left hand pain after puncture wounds from a thorn. States this occurred over the last several weeks. She has been squeezing the site to try to get the "infection" out. Thorns have been removed. States she is concerned about arthritis or infection from the thorns. Her Tdap is up to date.      Relevant past medical, surgical, family, and social history reviewed and updated as indicated.  Allergies and medications reviewed and updated. Data reviewed: Chart in Epic.   Past Medical History:  Diagnosis Date   Allergy    Hyperlipidemia    Thyroid disease     Past Surgical History:  Procedure Laterality Date   EYE SURGERY     HAMMER TOE SURGERY     RIGHT EYE SURGERY     TUBAL LIGATION      Social History   Socioeconomic History   Marital status: Married    Spouse name: Not on file   Number of children: Not on file   Years of education: Not on file   Highest education level: Not on file  Occupational History   Not on file  Tobacco Use   Smoking status: Every Day    Packs/day: 1    Types: Cigarettes   Smokeless tobacco: Never  Vaping Use   Vaping Use: Never used  Substance and Sexual Activity   Alcohol use: No   Drug use: No   Sexual activity: Not on file  Other Topics Concern   Not on file  Social History Narrative   Not on file   Social Determinants of Health   Financial Resource Strain: Not on file  Food Insecurity: Not on file  Transportation Needs: Not on file  Physical Activity: Not on file   Stress: Not on file  Social Connections: Not on file  Intimate Partner Violence: Not on file    Outpatient Encounter Medications as of 02/24/2023  Medication Sig   Black Cohosh 40 MG CAPS Take 40 mg by mouth 3 (three) times daily.   cephALEXin (KEFLEX) 500 MG capsule Take 1 capsule (500 mg total) by mouth 4 (four) times daily for 7 days.   Cholecalciferol (VITAMIN D3) 2000 UNITS capsule Take 2,000 Units by mouth daily.   conjugated estrogens (PREMARIN) vaginal cream INSERT (1) APPLICATORFUL INTO THE VAGINA EVERY DAY -FOR VAGINAL USE-   fexofenadine (ALLEGRA) 60 MG tablet Take 60 mg by mouth 2 (two) times daily.   fluticasone (FLONASE) 50 MCG/ACT nasal spray Place 2 sprays into both nostrils daily.   levocetirizine (XYZAL) 5 MG tablet Take 1 tablet (5 mg total) by mouth every evening.   levothyroxine (SYNTHROID) 125 MCG tablet Take 1 tablet (125 mcg total) by mouth daily before breakfast.   lidocaine (XYLOCAINE) 2 % solution Use as directed 15 mLs in the mouth or throat as needed for mouth pain.   Multiple Vitamin (MULTIVITAMIN) capsule Take 1 capsule by mouth daily.   Omega-3 Fatty Acids (  FISH OIL) 1000 MG CAPS Take by mouth.   rosuvastatin (CRESTOR) 10 MG tablet Take 1 tablet (10 mg total) by mouth daily.   S-Adenosylmethionine (SAM-E PO) Take by mouth.   triamcinolone cream (KENALOG) 0.1 % Apply 1 application topically 2 (two) times daily.   varenicline (CHANTIX CONTINUING MONTH PAK) 1 MG tablet Take 1 tablet (1 mg total) by mouth 2 (two) times daily.   Varenicline Tartrate, Starter, (CHANTIX STARTING MONTH PAK) 0.5 MG X 11 & 1 MG X 42 TBPK Take 1 tablet by mouth 2 (two) times daily.   No facility-administered encounter medications on file as of 02/24/2023.    Allergies  Allergen Reactions   Paxlovid [Nirmatrelvir-Ritonavir]     Review of Systems  Musculoskeletal:  Positive for arthralgias and joint swelling.  Skin:  Positive for color change and wound.  All other systems  reviewed and are negative.       Objective:  BP 125/74   Pulse 70   Temp (!) 97.1 F (36.2 C) (Temporal)   Ht 5\' 9"  (1.753 m)   Wt 171 lb 3.2 oz (77.7 kg)   SpO2 95%   BMI 25.28 kg/m    Wt Readings from Last 3 Encounters:  02/24/23 171 lb 3.2 oz (77.7 kg)  02/05/23 173 lb (78.5 kg)  08/07/22 176 lb (79.8 kg)    Physical Exam Vitals and nursing note reviewed.  Constitutional:      General: She is not in acute distress.    Appearance: Normal appearance. She is well-developed and well-groomed. She is not ill-appearing, toxic-appearing or diaphoretic.  HENT:     Head: Normocephalic and atraumatic.     Jaw: There is normal jaw occlusion.     Right Ear: Hearing normal.     Left Ear: Hearing normal.     Nose: Nose normal.     Mouth/Throat:     Lips: Pink.     Mouth: Mucous membranes are moist.     Pharynx: Uvula midline.  Eyes:     General: Lids are normal.     Pupils: Pupils are equal, round, and reactive to light.  Neck:     Trachea: Trachea and phonation normal.  Cardiovascular:     Rate and Rhythm: Normal rate and regular rhythm.     Chest Wall: PMI is not displaced.     Heart sounds:     No friction rub.  Pulmonary:     Effort: Pulmonary effort is normal.  Abdominal:     General: Bowel sounds are normal. There is no distension or abdominal bruit.     Palpations: There is no hepatomegaly or splenomegaly.     Tenderness: There is no abdominal tenderness. There is no right CVA tenderness or left CVA tenderness.     Hernia: No hernia is present.  Musculoskeletal:        General: Normal range of motion.     Cervical back: Neck supple.     Right lower leg: No edema.     Left lower leg: No edema.  Skin:    General: Skin is warm and dry.     Capillary Refill: Capillary refill takes less than 2 seconds.     Coloration: Skin is not cyanotic, jaundiced or pale.     Findings: Erythema and wound (three puncutre wounds to left index finger with surrounding erythema, no  foreign body noted) present. No rash.  Neurological:     General: No focal deficit present.     Mental  Status: She is alert and oriented to person, place, and time.     Sensory: Sensation is intact.     Motor: Motor function is intact.     Coordination: Coordination is intact.     Gait: Gait is intact.     Deep Tendon Reflexes: Reflexes are normal and symmetric.  Psychiatric:        Attention and Perception: Attention and perception normal.        Mood and Affect: Mood and affect normal.        Speech: Speech normal.        Behavior: Behavior normal. Behavior is cooperative.        Thought Content: Thought content normal.        Cognition and Memory: Cognition and memory normal.        Judgment: Judgment normal.     Results for orders placed or performed in visit on 02/05/23  CBC with Differential/Platelet  Result Value Ref Range   WBC 5.2 3.4 - 10.8 x10E3/uL   RBC 4.00 3.77 - 5.28 x10E6/uL   Hemoglobin 12.3 11.1 - 15.9 g/dL   Hematocrit 37.4 34.0 - 46.6 %   MCV 94 79 - 97 fL   MCH 30.8 26.6 - 33.0 pg   MCHC 32.9 31.5 - 35.7 g/dL   RDW 13.7 11.7 - 15.4 %   Platelets 237 150 - 450 x10E3/uL   Neutrophils 57 Not Estab. %   Lymphs 34 Not Estab. %   Monocytes 7 Not Estab. %   Eos 1 Not Estab. %   Basos 1 Not Estab. %   Neutrophils Absolute 3.0 1.4 - 7.0 x10E3/uL   Lymphocytes Absolute 1.8 0.7 - 3.1 x10E3/uL   Monocytes Absolute 0.3 0.1 - 0.9 x10E3/uL   EOS (ABSOLUTE) 0.1 0.0 - 0.4 x10E3/uL   Basophils Absolute 0.1 0.0 - 0.2 x10E3/uL   Immature Granulocytes 0 Not Estab. %   Immature Grans (Abs) 0.0 0.0 - 0.1 x10E3/uL  CMP14+EGFR  Result Value Ref Range   Glucose 92 70 - 99 mg/dL   BUN 15 8 - 27 mg/dL   Creatinine, Ser 0.97 0.57 - 1.00 mg/dL   eGFR 65 >59 mL/min/1.73   BUN/Creatinine Ratio 15 12 - 28   Sodium 140 134 - 144 mmol/L   Potassium 4.3 3.5 - 5.2 mmol/L   Chloride 100 96 - 106 mmol/L   CO2 25 20 - 29 mmol/L   Calcium 10.0 8.7 - 10.3 mg/dL   Total Protein 6.8  6.0 - 8.5 g/dL   Albumin 5.0 (H) 3.9 - 4.9 g/dL   Globulin, Total 1.8 1.5 - 4.5 g/dL   Albumin/Globulin Ratio 2.8 (H) 1.2 - 2.2   Bilirubin Total 0.2 0.0 - 1.2 mg/dL   Alkaline Phosphatase 55 44 - 121 IU/L   AST 30 0 - 40 IU/L   ALT 18 0 - 32 IU/L  Lipid panel  Result Value Ref Range   Cholesterol, Total 211 (H) 100 - 199 mg/dL   Triglycerides 65 0 - 149 mg/dL   HDL 102 >39 mg/dL   VLDL Cholesterol Cal 12 5 - 40 mg/dL   LDL Chol Calc (NIH) 97 0 - 99 mg/dL   Chol/HDL Ratio 2.1 0.0 - 4.4 ratio  Thyroid Panel With TSH  Result Value Ref Range   TSH 2.020 0.450 - 4.500 uIU/mL   T4, Total 11.0 4.5 - 12.0 ug/dL   T3 Uptake Ratio 27 24 - 39 %   Free Thyroxine Index 3.0  1.2 - 4.9       Pertinent labs & imaging results that were available during my care of the patient were reviewed by me and considered in my medical decision making.  Assessment & Plan:  Crickett was seen today for thorn .  Diagnoses and all orders for this visit:  Cellulitis of finger of left hand Puncture wound Symptomatic care discussed in detail. Medications as prescribed. Report new, worsening, or persistent symptoms.  -     cephALEXin (KEFLEX) 500 MG capsule; Take 1 capsule (500 mg total) by mouth 4 (four) times daily for 7 days.     Continue all other maintenance medications.  Follow up plan: Return if symptoms worsen or fail to improve.   Continue healthy lifestyle choices, including diet (rich in fruits, vegetables, and lean proteins, and low in salt and simple carbohydrates) and exercise (at least 30 minutes of moderate physical activity daily).  The above assessment and management plan was discussed with the patient. The patient verbalized understanding of and has agreed to the management plan. Patient is aware to call the clinic if they develop any new symptoms or if symptoms persist or worsen. Patient is aware when to return to the clinic for a follow-up visit. Patient educated on when it is appropriate  to go to the emergency department.   Monia Pouch, FNP-C [facility] [phone]

## 2023-03-19 DIAGNOSIS — D485 Neoplasm of uncertain behavior of skin: Secondary | ICD-10-CM | POA: Diagnosis not present

## 2023-03-19 DIAGNOSIS — L570 Actinic keratosis: Secondary | ICD-10-CM | POA: Diagnosis not present

## 2023-03-19 DIAGNOSIS — L821 Other seborrheic keratosis: Secondary | ICD-10-CM | POA: Diagnosis not present

## 2023-03-19 DIAGNOSIS — D225 Melanocytic nevi of trunk: Secondary | ICD-10-CM | POA: Diagnosis not present

## 2023-03-19 DIAGNOSIS — L814 Other melanin hyperpigmentation: Secondary | ICD-10-CM | POA: Diagnosis not present

## 2023-03-19 DIAGNOSIS — L578 Other skin changes due to chronic exposure to nonionizing radiation: Secondary | ICD-10-CM | POA: Diagnosis not present

## 2023-04-17 ENCOUNTER — Other Ambulatory Visit: Payer: Self-pay | Admitting: Nurse Practitioner

## 2023-04-17 DIAGNOSIS — F17200 Nicotine dependence, unspecified, uncomplicated: Secondary | ICD-10-CM

## 2023-07-22 DIAGNOSIS — R059 Cough, unspecified: Secondary | ICD-10-CM | POA: Diagnosis not present

## 2023-07-22 DIAGNOSIS — J209 Acute bronchitis, unspecified: Secondary | ICD-10-CM | POA: Diagnosis not present

## 2023-07-24 ENCOUNTER — Encounter: Payer: Self-pay | Admitting: Nurse Practitioner

## 2023-07-24 ENCOUNTER — Ambulatory Visit: Payer: Federal, State, Local not specified - PPO | Admitting: Nurse Practitioner

## 2023-07-24 VITALS — BP 120/78 | HR 68 | Temp 97.0°F | Resp 20 | Ht 69.0 in | Wt 176.0 lb

## 2023-07-24 DIAGNOSIS — E782 Mixed hyperlipidemia: Secondary | ICD-10-CM

## 2023-07-24 DIAGNOSIS — K219 Gastro-esophageal reflux disease without esophagitis: Secondary | ICD-10-CM | POA: Diagnosis not present

## 2023-07-24 DIAGNOSIS — Z6827 Body mass index (BMI) 27.0-27.9, adult: Secondary | ICD-10-CM

## 2023-07-24 DIAGNOSIS — E034 Atrophy of thyroid (acquired): Secondary | ICD-10-CM

## 2023-07-24 MED ORDER — LEVOTHYROXINE SODIUM 125 MCG PO TABS
125.0000 ug | ORAL_TABLET | Freq: Every day | ORAL | 1 refills | Status: DC
Start: 2023-07-24 — End: 2024-04-19

## 2023-07-24 MED ORDER — ROSUVASTATIN CALCIUM 10 MG PO TABS: 10.0000 mg | ORAL_TABLET | Freq: Every day | ORAL | 1 refills | Status: DC

## 2023-07-24 NOTE — Patient Instructions (Signed)
Exercising to Stay Healthy To become healthy and stay healthy, it is recommended that you do moderate-intensity and vigorous-intensity exercise. You can tell that you are exercising at a moderate intensity if your heart starts beating faster and you start breathing faster but can still hold a conversation. You can tell that you are exercising at a vigorous intensity if you are breathing much harder and faster and cannot hold a conversation while exercising. How can exercise benefit me? Exercising regularly is important. It has many health benefits, such as: Improving overall fitness, flexibility, and endurance. Increasing bone density. Helping with weight control. Decreasing body fat. Increasing muscle strength and endurance. Reducing stress and tension, anxiety, depression, or anger. Improving overall health. What guidelines should I follow while exercising? Before you start a new exercise program, talk with your health care provider. Do not exercise so much that you hurt yourself, feel dizzy, or get very short of breath. Wear comfortable clothes and wear shoes with good support. Drink plenty of water while you exercise to prevent dehydration or heat stroke. Work out until your breathing and your heartbeat get faster (moderate intensity). How often should I exercise? Choose an activity that you enjoy, and set realistic goals. Your health care provider can help you make an activity plan that is individually designed and works best for you. Exercise regularly as told by your health care provider. This may include: Doing strength training two times a week, such as: Lifting weights. Using resistance bands. Push-ups. Sit-ups. Yoga. Doing a certain intensity of exercise for a given amount of time. Choose from these options: A total of 150 minutes of moderate-intensity exercise every week. A total of 75 minutes of vigorous-intensity exercise every week. A mix of moderate-intensity and  vigorous-intensity exercise every week. Children, pregnant women, people who have not exercised regularly, people who are overweight, and older adults may need to talk with a health care provider about what activities are safe to perform. If you have a medical condition, be sure to talk with your health care provider before you start a new exercise program. What are some exercise ideas? Moderate-intensity exercise ideas include: Walking 1 mile (1.6 km) in about 15 minutes. Biking. Hiking. Golfing. Dancing. Water aerobics. Vigorous-intensity exercise ideas include: Walking 4.5 miles (7.2 km) or more in about 1 hour. Jogging or running 5 miles (8 km) in about 1 hour. Biking 10 miles (16.1 km) or more in about 1 hour. Lap swimming. Roller-skating or in-line skating. Cross-country skiing. Vigorous competitive sports, such as football, basketball, and soccer. Jumping rope. Aerobic dancing. What are some everyday activities that can help me get exercise? Yard work, such as: Pushing a lawn mower. Raking and bagging leaves. Washing your car. Pushing a stroller. Shoveling snow. Gardening. Washing windows or floors. How can I be more active in my day-to-day activities? Use stairs instead of an elevator. Take a walk during your lunch break. If you drive, park your car farther away from your work or school. If you take public transportation, get off one stop early and walk the rest of the way. Stand up or walk around during all of your indoor phone calls. Get up, stretch, and walk around every 30 minutes throughout the day. Enjoy exercise with a friend. Support to continue exercising will help you keep a regular routine of activity. Where to find more information You can find more information about exercising to stay healthy from: U.[place] Department of Health and Human Services: www.hhs.gov Centers for Disease Control and Prevention (  CDC): www.cdc.gov Summary Exercising regularly is  important. It will improve your overall fitness, flexibility, and endurance. Regular exercise will also improve your overall health. It can help you control your weight, reduce stress, and improve your bone density. Do not exercise so much that you hurt yourself, feel dizzy, or get very short of breath. Before you start a new exercise program, talk with your health care provider. This information is not intended to replace advice given to you by your health care provider. Make sure you discuss any questions you have with your health care provider. Document Revised: 03/15/2021 Document Reviewed: 03/15/2021 Elsevier Patient Education  2024 Elsevier Inc.  

## 2023-07-24 NOTE — Progress Notes (Signed)
Subjective:    Patient ID: Melissa Gilbert, female    DOB: 01/29/1956, 67 y.o.   MRN: [id number]   Chief Complaint: medical management of chronic issues     HPI:  Melissa Gilbert is a 67 y.o. who identifies as a female who was assigned female at birth.   Social history: Lives with: husband Work history: painter   Comes in today for follow up of the following chronic medical issues:  1. Mixed hyperlipidemia Does not really watch diet very closely and does no dedicated exercise Lab Results  Component Value Date   CHOL 211 (H) 02/05/2023   HDL 102 02/05/2023   LDLCALC 97 02/05/2023   TRIG 65 02/05/2023   CHOLHDL 2.1 02/05/2023     2. Hypothyroidism due to acquired atrophy of thyroid No issues that she is aware of Lab Results  Component Value Date   TSH 2.020 02/05/2023     3. Gastroesophageal reflux disease without esophagitis Uses OTC meds when needed  4. BMI 27.0-27.9,adult No recent weight changes Wt Readings from Last 3 Encounters:  07/24/23 176 lb (79.8 kg)  02/24/23 171 lb 3.2 oz (77.7 kg)  02/05/23 173 lb (78.5 kg)   BMI Readings from Last 3 Encounters:  07/24/23 25.99 kg/m  02/24/23 25.28 kg/m  02/05/23 25.55 kg/m      New complaints: None today  Allergies  Allergen Reactions   Paxlovid [Nirmatrelvir-Ritonavir]    Outpatient Encounter Medications as of 07/24/2023  Medication Sig   Black Cohosh 40 MG CAPS Take 40 mg by mouth 3 (three) times daily.   Cholecalciferol (VITAMIN D3) 2000 UNITS capsule Take 2,000 Units by mouth daily.   conjugated estrogens (PREMARIN) vaginal cream INSERT (1) APPLICATORFUL INTO THE VAGINA EVERY DAY -FOR VAGINAL USE-   fexofenadine (ALLEGRA) 60 MG tablet Take 60 mg by mouth 2 (two) times daily.   fluticasone (FLONASE) 50 MCG/ACT nasal spray Place 2 sprays into both nostrils daily.   levocetirizine (XYZAL) 5 MG tablet Take 1 tablet (5 mg total) by mouth every evening.   levothyroxine (SYNTHROID) 125 MCG tablet  Take 1 tablet (125 mcg total) by mouth daily before breakfast.   lidocaine (XYLOCAINE) 2 % solution Use as directed 15 mLs in the mouth or throat as needed for mouth pain.   Multiple Vitamin (MULTIVITAMIN) capsule Take 1 capsule by mouth daily.   Omega-3 Fatty Acids (FISH OIL) 1000 MG CAPS Take by mouth.   rosuvastatin (CRESTOR) 10 MG tablet Take 1 tablet (10 mg total) by mouth daily.   S-Adenosylmethionine (SAM-E PO) Take by mouth.   triamcinolone cream (KENALOG) 0.1 % Apply 1 application topically 2 (two) times daily.   varenicline (CHANTIX) 1 MG tablet TAKE ONE TABLET TWICE DAILY   Varenicline Tartrate, Starter, (CHANTIX STARTING MONTH PAK) 0.5 MG X 11 & 1 MG X 42 TBPK Take 1 tablet by mouth 2 (two) times daily.   No facility-administered encounter medications on file as of 07/24/2023.    Past Surgical History:  Procedure Laterality Date   EYE SURGERY     HAMMER TOE SURGERY     RIGHT EYE SURGERY     TUBAL LIGATION      Family History  Problem Relation Age of Onset   Alzheimer's disease Mother    Hypothyroidism Mother    Heart disease Father    Cancer Father        prostate cancer   Hyperlipidemia Father    Hypothyroidism Sister    Breast cancer Neg  Hx       Controlled substance contract: n/a     Review of Systems  Constitutional:  Negative for diaphoresis.  Eyes:  Negative for pain.  Respiratory:  Negative for shortness of breath.   Cardiovascular:  Negative for chest pain, palpitations and leg swelling.  Gastrointestinal:  Negative for abdominal pain.  Endocrine: Negative for polydipsia.  Skin:  Negative for rash.  Neurological:  Negative for dizziness, weakness and headaches.  Hematological:  Does not bruise/bleed easily.  All other systems reviewed and are negative.      Objective:   Physical Exam Vitals and nursing note reviewed.  Constitutional:      General: She is not in acute distress.    Appearance: Normal appearance. She is well-developed.   HENT:     Head: Normocephalic.     Right Ear: Tympanic membrane normal.     Left Ear: Tympanic membrane normal.     Nose: Nose normal.     Mouth/Throat:     Mouth: Mucous membranes are moist.  Eyes:     Pupils: Pupils are equal, round, and reactive to light.  Neck:     Vascular: No carotid bruit or JVD.  Cardiovascular:     Rate and Rhythm: Normal rate and regular rhythm.     Heart sounds: Normal heart sounds.  Pulmonary:     Effort: Pulmonary effort is normal. No respiratory distress.     Breath sounds: Normal breath sounds. No wheezing or rales.  Chest:     Chest wall: No tenderness.  Abdominal:     General: Bowel sounds are normal. There is no distension or abdominal bruit.     Palpations: Abdomen is soft. There is no hepatomegaly, splenomegaly, mass or pulsatile mass.     Tenderness: There is no abdominal tenderness.  Musculoskeletal:        General: Normal range of motion.     Cervical back: Normal range of motion and neck supple.  Lymphadenopathy:     Cervical: No cervical adenopathy.  Skin:    General: Skin is warm and dry.  Neurological:     Mental Status: She is alert and oriented to person, place, and time.     Deep Tendon Reflexes: Reflexes are normal and symmetric.  Psychiatric:        Behavior: Behavior normal.        Thought Content: Thought content normal.        Judgment: Judgment normal.    BP 120/78   Pulse 68   Temp (!) 97 F (36.1 C) (Temporal)   Resp 20   Ht 5\' 9"  (1.753 m)   Wt 176 lb (79.8 kg)   SpO2 97%   BMI 25.99 kg/m         Assessment & Plan:   Melissa Gilbert comes in today with chief complaint of Medical Management of Chronic Issues   Diagnosis and orders addressed:  1. Mixed hyperlipidemia Low fat diet - rosuvastatin (CRESTOR) 10 MG tablet; Take 1 tablet (10 mg total) by mouth daily.  Dispense: 90 tablet; Refill: 1 - CBC with Differential/Platelet - Lipid panel - CMP14+EGFR  2. Hypothyroidism due to acquired atrophy  of thyroid Labs pending - levothyroxine (SYNTHROID) 125 MCG tablet; Take 1 tablet (125 mcg total) by mouth daily before breakfast.  Dispense: 90 tablet; Refill: 1 - Thyroid Panel With TSH  3. Gastroesophageal reflux disease without esophagitis Avoid spicy foods Do not eat 2 hours prior to bedtime  4. BMI 27.0-27.9,adult  Discussed diet and exercise for person with BMI >25 Will recheck weight in 3-6 months    Labs pending Health Maintenance reviewed Diet and exercise encouraged  Follow up plan: 6 months   Mary-Margaret Daphine Deutscher, FNP

## 2023-07-26 LAB — CMP14+EGFR
ALT: 16 IU/L (ref 0–32)
AST: 26 IU/L (ref 0–40)
Albumin: 4.7 g/dL (ref 3.9–4.9)
Alkaline Phosphatase: 79 IU/L (ref 44–121)
BUN/Creatinine Ratio: 20 (ref 12–28)
BUN: 16 mg/dL (ref 8–27)
Bilirubin Total: <0.2 mg/dL (ref 0.0–1.2)
CO2: 23 mmol/L (ref 20–29)
Calcium: 10.4 mg/dL — ABNORMAL HIGH (ref 8.7–10.3)
Chloride: 100 mmol/L (ref 96–106)
Creatinine, Ser: 0.79 mg/dL (ref 0.57–1.00)
Globulin, Total: 2 g/dL (ref 1.5–4.5)
Glucose: 120 mg/dL — ABNORMAL HIGH (ref 70–99)
Potassium: 4.6 mmol/L (ref 3.5–5.2)
Sodium: 143 mmol/L (ref 134–144)
Total Protein: 6.7 g/dL (ref 6.0–8.5)
eGFR: 83 mL/min/{1.73_m2} (ref >59)

## 2023-07-26 LAB — CBC WITH DIFFERENTIAL/PLATELET
Basophils Absolute: 0 10*3/uL (ref 0.0–0.2)
Basos: 0 %
EOS (ABSOLUTE): 0 10*3/uL (ref 0.0–0.4)
Eos: 0 %
Hematocrit: 41 % (ref 34.0–46.6)
Hemoglobin: 13.4 g/dL (ref 11.1–15.9)
Immature Grans (Abs): 0 10*3/uL (ref 0.0–0.1)
Immature Granulocytes: 0 %
Lymphocytes Absolute: 0.8 10*3/uL (ref 0.7–3.1)
Lymphs: 21 %
MCH: 30.6 pg (ref 26.6–33.0)
MCHC: 32.7 g/dL (ref 31.5–35.7)
MCV: 94 fL (ref 79–97)
Monocytes Absolute: 0.1 10*3/uL (ref 0.1–0.9)
Monocytes: 2 %
Neutrophils Absolute: 2.8 10*3/uL (ref 1.4–7.0)
Neutrophils: 77 %
Platelets: 218 10*3/uL (ref 150–450)
RBC: 4.38 x10E6/uL (ref 3.77–5.28)
RDW: 13 % (ref 11.7–15.4)
WBC: 3.7 10*3/uL (ref 3.4–10.8)

## 2023-07-26 LAB — LIPID PANEL
Chol/HDL Ratio: 2.9 ratio (ref 0.0–4.4)
Cholesterol, Total: 252 mg/dL — ABNORMAL HIGH (ref 100–199)
HDL: 87 mg/dL (ref >39)
LDL Chol Calc (NIH): 147 mg/dL — ABNORMAL HIGH (ref 0–99)
Triglycerides: 108 mg/dL (ref 0–149)
VLDL Cholesterol Cal: 18 mg/dL (ref 5–40)

## 2023-07-26 LAB — THYROID PANEL WITH TSH
Free Thyroxine Index: 2.8 (ref 1.2–4.9)
T3 Uptake Ratio: 27 % (ref 24–39)
T4, Total: 10.2 ug/dL (ref 4.5–12.0)
TSH: 0.621 u[IU]/mL (ref 0.450–4.500)

## 2023-08-10 ENCOUNTER — Ambulatory Visit: Payer: Federal, State, Local not specified - PPO | Admitting: Nurse Practitioner

## 2023-08-20 ENCOUNTER — Ambulatory Visit: Payer: Federal, State, Local not specified - PPO | Admitting: Nurse Practitioner

## 2023-09-29 ENCOUNTER — Other Ambulatory Visit: Payer: Self-pay | Admitting: Nurse Practitioner

## 2023-09-29 DIAGNOSIS — Z1231 Encounter for screening mammogram for malignant neoplasm of breast: Secondary | ICD-10-CM

## 2023-10-01 DIAGNOSIS — Z08 Encounter for follow-up examination after completed treatment for malignant neoplasm: Secondary | ICD-10-CM | POA: Diagnosis not present

## 2023-10-01 DIAGNOSIS — L814 Other melanin hyperpigmentation: Secondary | ICD-10-CM | POA: Diagnosis not present

## 2023-10-01 DIAGNOSIS — D225 Melanocytic nevi of trunk: Secondary | ICD-10-CM | POA: Diagnosis not present

## 2023-10-01 DIAGNOSIS — L821 Other seborrheic keratosis: Secondary | ICD-10-CM | POA: Diagnosis not present

## 2023-10-05 ENCOUNTER — Ambulatory Visit
Admission: RE | Admit: 2023-10-05 | Discharge: 2023-10-05 | Disposition: A | Discharge status: Home / Self Care | Payer: Federal, State, Local not specified - PPO | Source: Ambulatory Visit | Attending: Nurse Practitioner | Admitting: Nurse Practitioner

## 2023-10-05 DIAGNOSIS — Z1231 Encounter for screening mammogram for malignant neoplasm of breast: Secondary | ICD-10-CM

## 2023-10-21 DIAGNOSIS — Z01419 Encounter for gynecological examination (general) (routine) without abnormal findings: Secondary | ICD-10-CM | POA: Diagnosis not present

## 2023-10-22 ENCOUNTER — Other Ambulatory Visit: Payer: Self-pay | Admitting: Obstetrics and Gynecology

## 2023-10-22 DIAGNOSIS — Z13820 Encounter for screening for osteoporosis: Secondary | ICD-10-CM

## 2023-10-26 ENCOUNTER — Ambulatory Visit: Payer: Federal, State, Local not specified - PPO | Admitting: Nurse Practitioner

## 2023-12-25 ENCOUNTER — Ambulatory Visit (INDEPENDENT_AMBULATORY_CARE_PROVIDER_SITE_OTHER): Payer: Medicare Other | Admitting: Nurse Practitioner

## 2023-12-25 ENCOUNTER — Encounter: Payer: Self-pay | Admitting: Nurse Practitioner

## 2023-12-25 ENCOUNTER — Ambulatory Visit (INDEPENDENT_AMBULATORY_CARE_PROVIDER_SITE_OTHER): Payer: Medicare Other

## 2023-12-25 VITALS — BP 133/76 | HR 66 | Ht 69.0 in | Wt 190.0 lb

## 2023-12-25 DIAGNOSIS — R109 Unspecified abdominal pain: Secondary | ICD-10-CM | POA: Diagnosis not present

## 2023-12-25 DIAGNOSIS — K5901 Slow transit constipation: Secondary | ICD-10-CM

## 2023-12-25 NOTE — Patient Instructions (Signed)

## 2023-12-25 NOTE — Progress Notes (Signed)
   Subjective:    Patient ID: LORICE LAFAVE, female    DOB: 1956-05-28, 68 y.o.   MRN: [id number]   Chief Complaint: Gas issues since January and Left side pain   HPI  Patient comes in c/o being gassy and having slight left upper quadrant pain. Rates pain 1-2/10. Gas blows her abdomen up and smells really bad when she poops.  Patient Active Problem List   Diagnosis Date Noted   BMI 27.0-27.9,adult 02/26/2016   Hypothyroidism 04/04/2013   Hyperlipidemia 04/04/2013   GERD (gastroesophageal reflux disease) 04/04/2013   Allergic rhinitis 04/04/2013       Review of Systems  Constitutional:  Negative for diaphoresis.  Eyes:  Negative for pain.  Respiratory:  Negative for shortness of breath.   Cardiovascular:  Negative for chest pain, palpitations and leg swelling.  Gastrointestinal:  Negative for abdominal pain.  Endocrine: Negative for polydipsia.  Skin:  Negative for rash.  Neurological:  Negative for dizziness, weakness and headaches.  Hematological:  Does not bruise/bleed easily.  All other systems reviewed and are negative.      Objective:   Physical Exam Constitutional:      Appearance: Normal appearance.  Cardiovascular:     Rate and Rhythm: Normal rate and regular rhythm.     Heart sounds: Normal heart sounds.  Pulmonary:     Breath sounds: Normal breath sounds.  Abdominal:     General: Abdomen is flat. There is no distension.     Palpations: Abdomen is soft. There is no mass.     Tenderness: There is no abdominal tenderness. There is no guarding or rebound.     Hernia: No hernia is present.  Skin:    General: Skin is warm.  Neurological:     General: No focal deficit present.     Mental Status: She is alert and oriented to person, place, and time.  Psychiatric:        Mood and Affect: Mood normal.        Behavior: Behavior normal.    BP 133/76   Pulse 66   Ht 5\' 9"  (1.753 m)   Wt 190 lb (86.2 kg)   SpO2 99%   BMI 28.06 kg/m   KUB-  constipation-Preliminary reading by Paulene Floor, FNP  [facility]        Assessment & Plan:   [name] in today with chief complaint of Gas issues since January and Left side pain   1. Left sided abdominal pain (Primary) - DG Abd 1 View  2. Slow transit constipation Milk of magnesia and 6 oz prune juice Miralax daily in apple juice Increase fiber in diet RTO prn    The above assessment and management plan was discussed with the patient. The patient verbalized understanding of and has agreed to the management plan. Patient is aware to call the clinic if symptoms persist or worsen. Patient is aware when to return to the clinic for a follow-up visit. Patient educated on when it is appropriate to go to the emergency department.   Mary-Margaret Daphine Deutscher, FNP

## 2024-02-11 ENCOUNTER — Ambulatory Visit (INDEPENDENT_AMBULATORY_CARE_PROVIDER_SITE_OTHER): Admitting: Nurse Practitioner

## 2024-02-11 ENCOUNTER — Encounter: Payer: Self-pay | Admitting: Nurse Practitioner

## 2024-02-11 ENCOUNTER — Ambulatory Visit (INDEPENDENT_AMBULATORY_CARE_PROVIDER_SITE_OTHER)

## 2024-02-11 VITALS — BP 127/80 | HR 71 | Temp 97.4°F | Ht 69.0 in | Wt 191.0 lb

## 2024-02-11 DIAGNOSIS — R109 Unspecified abdominal pain: Secondary | ICD-10-CM

## 2024-02-11 DIAGNOSIS — K5901 Slow transit constipation: Secondary | ICD-10-CM | POA: Diagnosis not present

## 2024-02-11 MED ORDER — LINACLOTIDE 145 MCG PO CAPS
145.0000 ug | ORAL_CAPSULE | Freq: Every day | ORAL | 2 refills | Status: AC
Start: 2024-02-11 — End: ?

## 2024-02-11 MED ORDER — LINACLOTIDE 145 MCG PO CAPS: 145.0000 ug | ORAL_CAPSULE | Freq: Every day | ORAL | 2 refills | Status: DC

## 2024-02-11 NOTE — Patient Instructions (Signed)

## 2024-02-11 NOTE — Addendum Note (Signed)
 Addended by: Bennie Pierini on: 02/11/2024 11:53 AM   Modules accepted: Orders

## 2024-02-11 NOTE — Progress Notes (Signed)
   Subjective:    Patient ID: Melissa Gilbert, female    DOB: 1956-10-16, 68 y.o.   MRN: [id number]   Chief Complaint: Flank Pain (Left side/)   HPI  Patient was seen on 12/25/23 with left lower abdominal pain. KUB showed constipation. Says she cleaned herself out but still having intermittent pain. Patient Active Problem List   Diagnosis Date Noted   BMI 27.0-27.9,adult 02/26/2016   Hypothyroidism 04/04/2013   Hyperlipidemia 04/04/2013   GERD (gastroesophageal reflux disease) 04/04/2013   Allergic rhinitis 04/04/2013       Review of Systems  Constitutional:  Negative for diaphoresis.  Eyes:  Negative for pain.  Respiratory:  Negative for shortness of breath.   Cardiovascular:  Negative for chest pain, palpitations and leg swelling.  Gastrointestinal:  Negative for abdominal pain.  Endocrine: Negative for polydipsia.  Skin:  Negative for rash.  Neurological:  Negative for dizziness, weakness and headaches.  Hematological:  Does not bruise/bleed easily.  All other systems reviewed and are negative.      Objective:   Physical Exam Cardiovascular:     Rate and Rhythm: Normal rate and regular rhythm.     Heart sounds: Normal heart sounds.  Pulmonary:     Breath sounds: Normal breath sounds.  Abdominal:     General: Abdomen is flat. Bowel sounds are normal.  Neurological:     Mental Status: She is alert and oriented to person, place, and time.  Psychiatric:        Mood and Affect: Mood normal.        Behavior: Behavior normal.    BP 127/80   Pulse 71   Temp (!) 97.4 F (36.3 C) (Temporal)   Ht 5\' 9"  (1.753 m)   Wt 191 lb (86.6 kg)   SpO2 96%   BMI 28.21 kg/m         Assessment & Plan:   [name] in today with chief complaint of Flank Pain (Left side/)   1. Left flank pain (Primary) - DG Abd 1 View  2. Slow transit constipation Increase fiber in diet Force fluids Recheck in 2 weeks - linaclotide (LINZESS) 145 MCG CAPS capsule; Take 1 capsule  (145 mcg total) by mouth daily before breakfast.  Dispense: 30 capsule; Refill: 2    The above assessment and management plan was discussed with the patient. The patient verbalized understanding of and has agreed to the management plan. Patient is aware to call the clinic if symptoms persist or worsen. Patient is aware when to return to the clinic for a follow-up visit. Patient educated on when it is appropriate to go to the emergency department.   Mary-Margaret Daphine Deutscher, FNP

## 2024-02-18 ENCOUNTER — Telehealth: Payer: Self-pay

## 2024-02-18 ENCOUNTER — Other Ambulatory Visit (HOSPITAL_COMMUNITY): Payer: Self-pay

## 2024-02-18 NOTE — Telephone Encounter (Signed)
 Pharmacy Patient Advocate Encounter   Received notification from CoverMyMeds that prior authorization for Linzess capsules is required/requested.   Insurance verification completed.   The patient is insured through [facility] .   Per test claim: Refill too soon. PA is not needed at this time. Medication was filled 02/11/24. Next eligible fill date is 03/05/24.

## 2024-02-22 ENCOUNTER — Encounter: Payer: Self-pay | Admitting: Nurse Practitioner

## 2024-02-22 ENCOUNTER — Ambulatory Visit: Payer: Medicare Other | Admitting: Nurse Practitioner

## 2024-02-22 VITALS — BP 125/68 | HR 80 | Temp 97.6°F | Ht 69.0 in | Wt 189.0 lb

## 2024-02-22 DIAGNOSIS — K219 Gastro-esophageal reflux disease without esophagitis: Secondary | ICD-10-CM

## 2024-02-22 DIAGNOSIS — E782 Mixed hyperlipidemia: Secondary | ICD-10-CM | POA: Diagnosis not present

## 2024-02-22 DIAGNOSIS — Z6827 Body mass index (BMI) 27.0-27.9, adult: Secondary | ICD-10-CM

## 2024-02-22 DIAGNOSIS — E034 Atrophy of thyroid (acquired): Secondary | ICD-10-CM | POA: Diagnosis not present

## 2024-02-22 DIAGNOSIS — Z0000 Encounter for general adult medical examination without abnormal findings: Secondary | ICD-10-CM

## 2024-02-22 NOTE — Progress Notes (Signed)
 Subjective:    Patient ID: Melissa Gilbert, female    DOB: 1956/06/11, 68 y.o.   MRN: [id number]   Chief Complaint:  annual physical  HPI:  Melissa Gilbert is a 68 y.o. who identifies as a female who was assigned female at birth.   Social history: Lives with: husband Work history: painter   Comes in today for follow up of the following chronic medical issues:  1. Mixed hyperlipidemia Does try  to watch diet. Does not do any dedicated exercise.; Lab Results  Component Value Date   CHOL 252 (H) 07/24/2023   HDL 87 07/24/2023   LDLCALC 147 (H) 07/24/2023   TRIG 108 07/24/2023   CHOLHDL 2.9 07/24/2023     2. Hypothyroidism due to acquired atrophy of thyroid No issues that she is aware of. Lab Results  Component Value Date   TSH 0.621 07/24/2023     3. Gastroesophageal reflux disease without esophagitis Takes OTC meds when needed  4. BMI 27.0-27.9,adult No recent weight changes Wt Readings from Last 3 Encounters:  02/22/24 189 lb (85.7 kg)  02/11/24 191 lb (86.6 kg)  12/25/23 190 lb (86.2 kg)   BMI Readings from Last 3 Encounters:  02/22/24 27.91 kg/m  02/11/24 28.21 kg/m  12/25/23 28.06 kg/m     New complaints:  Patient has been having major issues with constipation. She was started on linzess at last visit. She has been having bowel movements and feels better. Allergies  Allergen Reactions   Paxlovid [Nirmatrelvir-Ritonavir]    Outpatient Encounter Medications as of 02/22/2024  Medication Sig   albuterol (VENTOLIN HFA) 108 (90 Base) MCG/ACT inhaler Inhale into the lungs.   Black Cohosh 40 MG CAPS Take 40 mg by mouth 3 (three) times daily.   Cholecalciferol (VITAMIN D3) 2000 UNITS capsule Take 2,000 Units by mouth daily.   conjugated estrogens (PREMARIN) vaginal cream INSERT (1) APPLICATORFUL INTO THE VAGINA EVERY DAY -FOR VAGINAL USE-   fexofenadine (ALLEGRA) 60 MG tablet Take 60 mg by mouth 2 (two) times daily.   fluticasone (FLONASE) 50 MCG/ACT  nasal spray Place 2 sprays into both nostrils daily.   levocetirizine (XYZAL) 5 MG tablet Take 1 tablet (5 mg total) by mouth every evening.   levothyroxine (SYNTHROID) 125 MCG tablet Take 1 tablet (125 mcg total) by mouth daily before breakfast.   lidocaine (XYLOCAINE) 2 % solution Use as directed 15 mLs in the mouth or throat as needed for mouth pain.   linaclotide (LINZESS) 145 MCG CAPS capsule Take 1 capsule (145 mcg total) by mouth daily before breakfast.   Multiple Vitamin (MULTIVITAMIN) capsule Take 1 capsule by mouth daily.   Omega-3 Fatty Acids (FISH OIL) 1000 MG CAPS Take by mouth.   rosuvastatin (CRESTOR) 10 MG tablet Take 1 tablet (10 mg total) by mouth daily.   S-Adenosylmethionine (SAM-E PO) Take by mouth.   triamcinolone cream (KENALOG) 0.1 % Apply 1 application topically 2 (two) times daily.   varenicline (CHANTIX) 1 MG tablet TAKE ONE TABLET TWICE DAILY   No facility-administered encounter medications on file as of 02/22/2024.    Past Surgical History:  Procedure Laterality Date   EYE SURGERY     HAMMER TOE SURGERY     RIGHT EYE SURGERY     TUBAL LIGATION      Family History  Problem Relation Age of Onset   Alzheimer's disease Mother    Hypothyroidism Mother    Heart disease Father    Cancer Father  prostate cancer   Hyperlipidemia Father    Hypothyroidism Sister    Breast cancer Neg Hx       Controlled substance contract: n/a     Review of Systems  Constitutional:  Negative for diaphoresis.  Eyes:  Negative for pain.  Respiratory:  Negative for shortness of breath.   Cardiovascular:  Negative for chest pain, palpitations and leg swelling.  Gastrointestinal:  Negative for abdominal pain.  Endocrine: Negative for polydipsia.  Skin:  Negative for rash.  Neurological:  Negative for dizziness, weakness and headaches.  Hematological:  Does not bruise/bleed easily.  All other systems reviewed and are negative.      Objective:   Physical  Exam Vitals and nursing note reviewed.  Constitutional:      General: She is not in acute distress.    Appearance: Normal appearance. She is well-developed.  HENT:     Head: Normocephalic.     Right Ear: Tympanic membrane normal.     Left Ear: Tympanic membrane normal.     Nose: Nose normal.     Mouth/Throat:     Mouth: Mucous membranes are moist.  Eyes:     Pupils: Pupils are equal, round, and reactive to light.  Neck:     Vascular: No carotid bruit or JVD.  Cardiovascular:     Rate and Rhythm: Normal rate and regular rhythm.     Heart sounds: Normal heart sounds.  Pulmonary:     Effort: Pulmonary effort is normal. No respiratory distress.     Breath sounds: Normal breath sounds. No wheezing or rales.  Chest:     Chest wall: No tenderness.  Abdominal:     General: Bowel sounds are normal. There is no distension or abdominal bruit.     Palpations: Abdomen is soft. There is no hepatomegaly, splenomegaly, mass or pulsatile mass.     Tenderness: There is no abdominal tenderness.  Musculoskeletal:        General: Normal range of motion.     Cervical back: Normal range of motion and neck supple.     Comments: FROM of  lumbar spine without pain today (-) SLR bil FROM of right knee without pain.  Lymphadenopathy:     Cervical: No cervical adenopathy.  Skin:    General: Skin is warm and dry.  Neurological:     Mental Status: She is alert and oriented to person, place, and time.     Deep Tendon Reflexes: Reflexes are normal and symmetric.  Psychiatric:        Behavior: Behavior normal.        Thought Content: Thought content normal.        Judgment: Judgment normal.     BP 125/68   Pulse 80   Temp 97.6 F (36.4 C) (Temporal)   Ht 5\' 9"  (1.753 m)   Wt 189 lb (85.7 kg)   SpO2 95%   BMI 27.91 kg/m         Assessment & Plan:   Melissa Gilbert comes in today with chief complaint of medical management of chronic issues    Diagnosis and orders addressed:  1. Annual  physical exam  2. Mixed hyperlipidemia Low fat diet - CBC with Differential/Platelet - CMP14+EGFR - Lipid panel - Thyroid Panel With TSH  3. Hypothyroidism due to acquired atrophy of thyroid Labs pending  4. Gastroesophageal reflux disease without esophagitis Avoid spicy foods Do not eat 2 hours prior to bedtime   5. BMI 27.0-27.9,adult Discussed diet  and exercise for person with BMI >25 Will recheck weight in 3-6 months    Labs pending Health Maintenance reviewed Diet and exercise encouraged  Follow up plan: 6 months   Mary-Margaret Daphine Deutscher, FNP

## 2024-02-22 NOTE — Patient Instructions (Signed)

## 2024-02-23 LAB — CBC WITH DIFFERENTIAL/PLATELET
Basophils Absolute: 0.1 10*3/uL (ref 0.0–0.2)
Basos: 1 %
EOS (ABSOLUTE): 0 10*3/uL (ref 0.0–0.4)
Eos: 1 %
Hematocrit: 37.9 % (ref 34.0–46.6)
Hemoglobin: 12.5 g/dL (ref 11.1–15.9)
Immature Grans (Abs): 0 10*3/uL (ref 0.0–0.1)
Immature Granulocytes: 0 %
Lymphocytes Absolute: 1.9 10*3/uL (ref 0.7–3.1)
Lymphs: 32 %
MCH: 30.6 pg (ref 26.6–33.0)
MCHC: 33 g/dL (ref 31.5–35.7)
MCV: 93 fL (ref 79–97)
Monocytes Absolute: 0.3 10*3/uL (ref 0.1–0.9)
Monocytes: 5 %
Neutrophils Absolute: 3.7 10*3/uL (ref 1.4–7.0)
Neutrophils: 61 %
Platelets: 231 10*3/uL (ref 150–450)
RBC: 4.09 x10E6/uL (ref 3.77–5.28)
RDW: 12.4 % (ref 11.7–15.4)
WBC: 6 10*3/uL (ref 3.4–10.8)

## 2024-02-23 LAB — CMP14+EGFR
ALT: 15 IU/L (ref 0–32)
AST: 23 IU/L (ref 0–40)
Albumin: 4.7 g/dL (ref 3.9–4.9)
Alkaline Phosphatase: 81 IU/L (ref 44–121)
BUN/Creatinine Ratio: 14 (ref 12–28)
BUN: 11 mg/dL (ref 8–27)
Bilirubin Total: 0.2 mg/dL (ref 0.0–1.2)
CO2: 24 mmol/L (ref 20–29)
Calcium: 9.6 mg/dL (ref 8.7–10.3)
Chloride: 101 mmol/L (ref 96–106)
Creatinine, Ser: 0.78 mg/dL (ref 0.57–1.00)
Globulin, Total: 1.8 g/dL (ref 1.5–4.5)
Glucose: 79 mg/dL (ref 70–99)
Potassium: 4.2 mmol/L (ref 3.5–5.2)
Sodium: 141 mmol/L (ref 134–144)
Total Protein: 6.5 g/dL (ref 6.0–8.5)
eGFR: 84 mL/min/{1.73_m2} (ref >59)

## 2024-02-23 LAB — THYROID PANEL WITH TSH
Free Thyroxine Index: 2.7 (ref 1.2–4.9)
T3 Uptake Ratio: 26 % (ref 24–39)
T4, Total: 10.5 ug/dL (ref 4.5–12.0)
TSH: 1.09 u[IU]/mL (ref 0.450–4.500)

## 2024-02-23 LAB — LIPID PANEL
Chol/HDL Ratio: 2.1 ratio (ref 0.0–4.4)
Cholesterol, Total: 186 mg/dL (ref 100–199)
HDL: 87 mg/dL (ref >39)
LDL Chol Calc (NIH): 71 mg/dL (ref 0–99)
Triglycerides: 171 mg/dL — ABNORMAL HIGH (ref 0–149)
VLDL Cholesterol Cal: 28 mg/dL (ref 5–40)

## 2024-03-29 ENCOUNTER — Encounter: Payer: Self-pay | Admitting: Family Medicine

## 2024-03-29 ENCOUNTER — Telehealth (INDEPENDENT_AMBULATORY_CARE_PROVIDER_SITE_OTHER): Admitting: Family Medicine

## 2024-03-29 DIAGNOSIS — R3989 Other symptoms and signs involving the genitourinary system: Secondary | ICD-10-CM | POA: Diagnosis not present

## 2024-03-29 MED ORDER — PHENAZOPYRIDINE HCL 100 MG PO TABS: 100.0000 mg | ORAL_TABLET | Freq: Three times a day (TID) | ORAL | 0 refills | Status: AC | PRN

## 2024-03-29 MED ORDER — CEPHALEXIN 500 MG PO CAPS: 500.0000 mg | ORAL_CAPSULE | Freq: Two times a day (BID) | ORAL | 0 refills | Status: AC

## 2024-03-29 NOTE — Progress Notes (Signed)
 MyChart Video visit  Subjective: CC: UTI PCP: Melissa Feller, FNP ZOX:Melissa Gilbert is a 68 y.o. female. Patient provides verbal consent for consult held via video.  Due to COVID-19 pandemic this visit was conducted virtually. This visit type was conducted due to national recommendations for restrictions regarding the COVID-19 Pandemic (e.g. social distancing, sheltering in place) in an effort to limit this patient's exposure and mitigate transmission in our community. All issues noted in this document were discussed and addressed.  A physical exam was not performed with this format.   Location of patient: home Location of provider: [facility] Others present for call: none  1. UTI Taking AZO for dysuria, frequency, urgency.  She reports onset this am.  No vaginal discharge or bleeding.  No flank pain, fevers, nausea or vomiting.  She hydrates well.    ROS: Per HPI  Allergies  Allergen Reactions   Paxlovid  [Nirmatrelvir -Ritonavir ]    Past Medical History:  Diagnosis Date   Allergy    Hyperlipidemia    Thyroid  disease     Current Outpatient Medications:    albuterol  (VENTOLIN  HFA) 108 (90 Base) MCG/ACT inhaler, Inhale into the lungs., Disp: , Rfl:    Black Cohosh 40 MG CAPS, Take 40 mg by mouth 3 (three) times daily., Disp: , Rfl:    Cholecalciferol (VITAMIN D3) 2000 UNITS capsule, Take 2,000 Units by mouth daily., Disp: , Rfl:    conjugated estrogens  (PREMARIN ) vaginal cream, INSERT (1) APPLICATORFUL INTO THE VAGINA EVERY DAY -FOR VAGINAL USE-, Disp: 30 g, Rfl: 1   fexofenadine  (ALLEGRA ) 60 MG tablet, Take 60 mg by mouth 2 (two) times daily., Disp: , Rfl:    fluticasone  (FLONASE ) 50 MCG/ACT nasal spray, Place 2 sprays into both nostrils daily., Disp: 16 g, Rfl: 6   levocetirizine (XYZAL ) 5 MG tablet, Take 1 tablet (5 mg total) by mouth every evening., Disp: 30 tablet, Rfl: 5   levothyroxine  (SYNTHROID ) 125 MCG tablet, Take 1 tablet (125 mcg total) by mouth daily before  breakfast., Disp: 90 tablet, Rfl: 1   lidocaine  (XYLOCAINE ) 2 % solution, Use as directed 15 mLs in the mouth or throat as needed for mouth pain., Disp: 100 mL, Rfl: 0   linaclotide  (LINZESS ) 145 MCG CAPS capsule, Take 1 capsule (145 mcg total) by mouth daily before breakfast., Disp: 30 capsule, Rfl: 2   Multiple Vitamin (MULTIVITAMIN) capsule, Take 1 capsule by mouth daily., Disp: , Rfl:    Omega-3 Fatty Acids (FISH OIL) 1000 MG CAPS, Take by mouth., Disp: , Rfl:    rosuvastatin  (CRESTOR ) 10 MG tablet, Take 1 tablet (10 mg total) by mouth daily., Disp: 90 tablet, Rfl: 1   S-Adenosylmethionine (SAM-E PO), Take by mouth., Disp: , Rfl:    triamcinolone  cream (KENALOG ) 0.1 %, Apply 1 application topically 2 (two) times daily., Disp: 30 g, Rfl: 1   varenicline  (CHANTIX ) 1 MG tablet, TAKE ONE TABLET TWICE DAILY, Disp: 60 tablet, Rfl: 1  Gen: well appearing female, NAD  Assessment/ Plan: 68 y.o. female   Suspected urinary tract infection - Plan: cephALEXin  (KEFLEX ) 500 MG capsule, phenazopyridine (PYRIDIUM) 100 MG tablet, Urinalysis, Routine w reflex microscopic, Urine Culture  Cephalexin  and pyridum sent.  Home care instructions reviewed.  Future orders for urinalysis and urine culture placed if no significant improvement in the next 48 hours.  She understands red flag signs and symptoms and reasons for reevaluation.  She will follow-up as needed  Start time: 1:39pm End time: 1:43pm  Total time spent on patient  care (including video visit/ documentation): 5 minutes  Melissa Gilbert Melissa Bonine, DO [facility] [phone]

## 2024-04-04 ENCOUNTER — Encounter: Payer: Self-pay | Admitting: Gastroenterology

## 2024-04-05 ENCOUNTER — Other Ambulatory Visit: Payer: Self-pay | Admitting: Gastroenterology

## 2024-04-05 DIAGNOSIS — R109 Unspecified abdominal pain: Secondary | ICD-10-CM

## 2024-04-07 ENCOUNTER — Ambulatory Visit
Admission: RE | Admit: 2024-04-07 | Discharge: 2024-04-07 | Disposition: A | Discharge status: Home / Self Care | Source: Ambulatory Visit | Attending: Gastroenterology | Admitting: Gastroenterology

## 2024-04-07 DIAGNOSIS — R109 Unspecified abdominal pain: Secondary | ICD-10-CM

## 2024-04-07 MED ORDER — IOPAMIDOL (ISOVUE-300) INJECTION 61%
80.0000 mL | Freq: Once | INTRAVENOUS | Status: AC | PRN
  Administered 2024-04-07: 80 mL via INTRAVENOUS

## 2024-04-08 ENCOUNTER — Telehealth: Payer: Self-pay

## 2024-04-08 NOTE — Telephone Encounter (Signed)
 Called and spoke with patient and [facility] ordered this CT. They notified patient of results and advised that they will not order any further testing because its not a GI issue. Advised patient to contact her PCP and have them order additional testing

## 2024-04-11 ENCOUNTER — Telehealth: Payer: Self-pay | Admitting: Nurse Practitioner

## 2024-04-11 ENCOUNTER — Other Ambulatory Visit: Payer: Self-pay

## 2024-04-11 DIAGNOSIS — R1900 Intra-abdominal and pelvic swelling, mass and lump, unspecified site: Secondary | ICD-10-CM

## 2024-04-14 ENCOUNTER — Ambulatory Visit
Admission: RE | Admit: 2024-04-14 | Discharge: 2024-04-14 | Discharge status: Home / Self Care | Source: Ambulatory Visit | Attending: Nurse Practitioner | Admitting: Nurse Practitioner

## 2024-04-14 ENCOUNTER — Encounter: Payer: Self-pay | Admitting: Nurse Practitioner

## 2024-04-14 ENCOUNTER — Other Ambulatory Visit: Payer: Self-pay | Admitting: Nurse Practitioner

## 2024-04-14 ENCOUNTER — Encounter: Payer: Self-pay | Admitting: Radiology

## 2024-04-14 DIAGNOSIS — E278 Other specified disorders of adrenal gland: Secondary | ICD-10-CM

## 2024-04-14 DIAGNOSIS — R1900 Intra-abdominal and pelvic swelling, mass and lump, unspecified site: Secondary | ICD-10-CM

## 2024-04-14 MED ORDER — IOPAMIDOL (ISOVUE-300) INJECTION 61%
100.0000 mL | Freq: Once | INTRAVENOUS | Status: AC | PRN
  Administered 2024-04-14: 100 mL via INTRAVENOUS

## 2024-04-14 NOTE — Telephone Encounter (Unsigned)
 Copied from CRM [id number]. Topic: General - Other >> Apr 14, 2024 10:22 AM Brynn Caras wrote: Reason for CRM: The patient is undergoing Ct Adrenal Abdomen WO Contrast. Techs at [facility] are wanting it to read W WO - With/WithoutContrast  instead of WO - Without, Annabella Barr wanted to confirm this will be updated and sent over for cosign.

## 2024-04-19 ENCOUNTER — Other Ambulatory Visit: Payer: Self-pay | Admitting: Nurse Practitioner

## 2024-04-19 DIAGNOSIS — E782 Mixed hyperlipidemia: Secondary | ICD-10-CM

## 2024-04-19 DIAGNOSIS — E034 Atrophy of thyroid (acquired): Secondary | ICD-10-CM

## 2024-04-20 ENCOUNTER — Telehealth: Payer: Self-pay | Admitting: Nurse Practitioner

## 2024-04-20 NOTE — Telephone Encounter (Signed)
 Pt checking in on referral status. She is aware 10 business day to complete.

## 2024-04-21 NOTE — Telephone Encounter (Signed)
 Sent Referral to Dr. Phylliss Brenner Office as Patient requested.  MyChart Message sent to Patient with Office contact information.

## 2024-04-21 NOTE — Telephone Encounter (Signed)
 Mychart information sent to patient per referrals.

## 2024-05-10 ENCOUNTER — Other Ambulatory Visit: Payer: Self-pay | Admitting: Obstetrics and Gynecology

## 2024-05-10 ENCOUNTER — Ambulatory Visit (HOSPITAL_COMMUNITY)
Admission: RE | Admit: 2024-05-10 | Discharge: 2024-05-10 | Disposition: A | Discharge status: Home / Self Care | Source: Ambulatory Visit | Attending: Obstetrics and Gynecology | Admitting: Obstetrics and Gynecology

## 2024-05-10 DIAGNOSIS — Z780 Asymptomatic menopausal state: Secondary | ICD-10-CM | POA: Diagnosis present

## 2024-06-17 ENCOUNTER — Telehealth: Payer: Self-pay

## 2024-06-17 NOTE — Telephone Encounter (Signed)
 Copied from CRM [id number]. Topic: General - Other >> Jun 17, 2024  3:23 PM Turkey B wrote: Reason for Crm. Pt asking when her last tetanus shot was

## 2024-06-17 NOTE — Telephone Encounter (Signed)
 Patient informed last tetanus was 08/13/2019

## 2024-06-27 ENCOUNTER — Other Ambulatory Visit: Payer: Federal, State, Local not specified - PPO

## 2024-07-01 ENCOUNTER — Other Ambulatory Visit: Payer: Self-pay

## 2024-07-01 ENCOUNTER — Telehealth: Payer: Self-pay

## 2024-07-01 DIAGNOSIS — E559 Vitamin D deficiency, unspecified: Secondary | ICD-10-CM

## 2024-07-01 NOTE — Telephone Encounter (Signed)
 Called and spoke with patient. She states that she has been seeing a specialist and has an upcoming surgery. Specialists wants Vitamin D  checked. Lab appt and future orders placed.

## 2024-07-01 NOTE — Telephone Encounter (Signed)
 Copied from CRM [id number]. Topic: General - Other >> Jul 01, 2024  1:14 PM Sophia H wrote: Reason for CRM: Patient wanting to know if PCP's nurse can reach out to her, came from an appointment with her endocrinologist and they wanted her to ask about her vitamin D  levels, also have some lab work that she wants to speak with them about. States has some updates on her adrenal gland she would like to provide as well. Ty # [phone]  Patient is aware of same day call back.

## 2024-07-05 ENCOUNTER — Other Ambulatory Visit

## 2024-07-05 ENCOUNTER — Other Ambulatory Visit: Payer: Self-pay

## 2024-07-05 DIAGNOSIS — E782 Mixed hyperlipidemia: Secondary | ICD-10-CM

## 2024-07-05 DIAGNOSIS — E559 Vitamin D deficiency, unspecified: Secondary | ICD-10-CM

## 2024-07-05 DIAGNOSIS — E034 Atrophy of thyroid (acquired): Secondary | ICD-10-CM

## 2024-07-06 LAB — VITAMIN D 25 HYDROXY (VIT D DEFICIENCY, FRACTURES): Vit D, 25-Hydroxy: 41.5 ng/mL (ref 30.0–100.0)

## 2024-07-07 ENCOUNTER — Ambulatory Visit: Payer: Self-pay | Admitting: Nurse Practitioner

## 2024-07-19 ENCOUNTER — Other Ambulatory Visit

## 2024-07-19 ENCOUNTER — Other Ambulatory Visit: Payer: Self-pay

## 2024-07-19 DIAGNOSIS — E270 Other adrenocortical overactivity: Secondary | ICD-10-CM

## 2024-07-19 DIAGNOSIS — E034 Atrophy of thyroid (acquired): Secondary | ICD-10-CM

## 2024-07-19 DIAGNOSIS — D3500 Benign neoplasm of unspecified adrenal gland: Secondary | ICD-10-CM

## 2024-07-19 DIAGNOSIS — E782 Mixed hyperlipidemia: Secondary | ICD-10-CM

## 2024-07-20 ENCOUNTER — Ambulatory Visit: Payer: Self-pay | Admitting: Nurse Practitioner

## 2024-07-26 LAB — CBC WITH DIFFERENTIAL/PLATELET
Basophils Absolute: 0 x10E3/uL (ref 0.0–0.2)
Basos: 1 %
EOS (ABSOLUTE): 0 x10E3/uL (ref 0.0–0.4)
Eos: 1 %
Hematocrit: 36.8 % (ref 34.0–46.6)
Hemoglobin: 11.9 g/dL (ref 11.1–15.9)
Immature Grans (Abs): 0 x10E3/uL (ref 0.0–0.1)
Immature Granulocytes: 0 %
Lymphocytes Absolute: 1 x10E3/uL (ref 0.7–3.1)
Lymphs: 29 %
MCH: 30.1 pg (ref 26.6–33.0)
MCHC: 32.3 g/dL (ref 31.5–35.7)
MCV: 93 fL (ref 79–97)
Monocytes Absolute: 0.2 x10E3/uL (ref 0.1–0.9)
Monocytes: 6 %
Neutrophils Absolute: 2.3 x10E3/uL (ref 1.4–7.0)
Neutrophils: 63 %
Platelets: 220 x10E3/uL (ref 150–450)
RBC: 3.95 x10E6/uL (ref 3.77–5.28)
RDW: 13.2 % (ref 11.7–15.4)
WBC: 3.6 x10E3/uL (ref 3.4–10.8)

## 2024-07-26 LAB — CMP14+EGFR
ALT: 14 IU/L (ref 0–32)
AST: 21 IU/L (ref 0–40)
Albumin: 4.8 g/dL (ref 3.9–4.9)
Alkaline Phosphatase: 84 IU/L (ref 44–121)
BUN/Creatinine Ratio: 10 — ABNORMAL LOW (ref 12–28)
BUN: 9 mg/dL (ref 8–27)
Bilirubin Total: 0.4 mg/dL (ref 0.0–1.2)
CO2: 22 mmol/L (ref 20–29)
Calcium: 9.3 mg/dL (ref 8.7–10.3)
Chloride: 103 mmol/L (ref 96–106)
Creatinine, Ser: 0.86 mg/dL (ref 0.57–1.00)
Globulin, Total: 1.7 g/dL (ref 1.5–4.5)
Glucose: 104 mg/dL — ABNORMAL HIGH (ref 70–99)
Potassium: 4.2 mmol/L (ref 3.5–5.2)
Sodium: 141 mmol/L (ref 134–144)
Total Protein: 6.5 g/dL (ref 6.0–8.5)
eGFR: 75 mL/min/1.73 (ref >59)

## 2024-07-26 LAB — THYROID PANEL WITH TSH
Free Thyroxine Index: 2.4 (ref 1.2–4.9)
T3 Uptake Ratio: 23 % — ABNORMAL LOW (ref 24–39)
T4, Total: 10.3 ug/dL (ref 4.5–12.0)
TSH: 3.22 u[IU]/mL (ref 0.450–4.500)

## 2024-07-26 LAB — RENIN: Renin Activity, Plasma: 1.062 ng/mL/h (ref 0.167–5.380)

## 2024-07-26 LAB — LIPID PANEL
Chol/HDL Ratio: 2.4 ratio (ref 0.0–4.4)
Cholesterol, Total: 203 mg/dL — ABNORMAL HIGH (ref 100–199)
HDL: 83 mg/dL (ref >39)
LDL Chol Calc (NIH): 102 mg/dL — ABNORMAL HIGH (ref 0–99)
Triglycerides: 102 mg/dL (ref 0–149)
VLDL Cholesterol Cal: 18 mg/dL (ref 5–40)

## 2024-07-26 LAB — RENAL FUNCTION PANEL: Phosphorus: 3.1 mg/dL (ref 3.0–4.3)

## 2024-07-26 LAB — DHEA-SULFATE: DHEA-SO4: 34.4 ug/dL (ref 20.4–186.6)

## 2024-07-26 LAB — ACTH: ACTH: 16.5 pg/mL (ref 7.2–63.3)

## 2024-07-26 LAB — ALDOSTERONE: Aldosterone: 1.9 ng/dL (ref 0.0–30.0)

## 2024-07-26 LAB — CORTISOL: Cortisol: 11.1 ug/dL (ref 6.2–19.4)

## 2024-08-19 ENCOUNTER — Telehealth: Payer: Self-pay | Admitting: Nurse Practitioner

## 2024-08-19 NOTE — Telephone Encounter (Signed)
 Patient has appt 9-23 for labs for MMM. Needs orders.

## 2024-08-22 NOTE — Telephone Encounter (Signed)
 Pt made aware that she does not need labs per [facility]. Lab appt cancelled.

## 2024-08-23 ENCOUNTER — Other Ambulatory Visit

## 2024-08-25 ENCOUNTER — Ambulatory Visit (INDEPENDENT_AMBULATORY_CARE_PROVIDER_SITE_OTHER): Admitting: Nurse Practitioner

## 2024-08-25 ENCOUNTER — Encounter: Payer: Self-pay | Admitting: Nurse Practitioner

## 2024-08-25 VITALS — BP 119/79 | HR 78 | Temp 97.7°F | Ht 69.0 in | Wt 196.0 lb

## 2024-08-25 DIAGNOSIS — K219 Gastro-esophageal reflux disease without esophagitis: Secondary | ICD-10-CM | POA: Diagnosis not present

## 2024-08-25 DIAGNOSIS — Z6827 Body mass index (BMI) 27.0-27.9, adult: Secondary | ICD-10-CM | POA: Diagnosis not present

## 2024-08-25 DIAGNOSIS — E782 Mixed hyperlipidemia: Secondary | ICD-10-CM

## 2024-08-25 DIAGNOSIS — E034 Atrophy of thyroid (acquired): Secondary | ICD-10-CM

## 2024-08-25 NOTE — Patient Instructions (Signed)
 Exercising to Stay Healthy To become healthy and stay healthy, it is recommended that you do moderate-intensity and vigorous-intensity exercise. You can tell that you are exercising at a moderate intensity if your heart starts beating faster and you start breathing faster but can still hold a conversation. You can tell that you are exercising at a vigorous intensity if you are breathing much harder and faster and cannot hold a conversation while exercising. How can exercise benefit me? Exercising regularly is important. It has many health benefits, such as: Improving overall fitness, flexibility, and endurance. Increasing bone density. Helping with weight control. Decreasing body fat. Increasing muscle strength and endurance. Reducing stress and tension, anxiety, depression, or anger. Improving overall health. What guidelines should I follow while exercising? Before you start a new exercise program, talk with your health care provider. Do not exercise so much that you hurt yourself, feel dizzy, or get very short of breath. Wear comfortable clothes and wear shoes with good support. Drink plenty of water while you exercise to prevent dehydration or heat stroke. Work out until your breathing and your heartbeat get faster (moderate intensity). How often should I exercise? Choose an activity that you enjoy, and set realistic goals. Your health care provider can help you make an activity plan that is individually designed and works best for you. Exercise regularly as told by your health care provider. This may include: Doing strength training two times a week, such as: Lifting weights. Using resistance bands. Push-ups. Sit-ups. Yoga. Doing a certain intensity of exercise for a given amount of time. Choose from these options: A total of 150 minutes of moderate-intensity exercise every week. A total of 75 minutes of vigorous-intensity exercise every week. A mix of moderate-intensity and  vigorous-intensity exercise every week. Children, pregnant women, people who have not exercised regularly, people who are overweight, and older adults may need to talk with a health care provider about what activities are safe to perform. If you have a medical condition, be sure to talk with your health care provider before you start a new exercise program. What are some exercise ideas? Moderate-intensity exercise ideas include: Walking 1 mile (1.6 km) in about 15 minutes. Biking. Hiking. Golfing. Dancing. Water aerobics. Vigorous-intensity exercise ideas include: Walking 4.5 miles (7.2 km) or more in about 1 hour. Jogging or running 5 miles (8 km) in about 1 hour. Biking 10 miles (16.1 km) or more in about 1 hour. Lap swimming. Roller-skating or in-line skating. Cross-country skiing. Vigorous competitive sports, such as football, basketball, and soccer. Jumping rope. Aerobic dancing. What are some everyday activities that can help me get exercise? Yard work, such as: Child psychotherapist. Raking and bagging leaves. Washing your car. Pushing a stroller. Shoveling snow. Gardening. Washing windows or floors. How can I be more active in my day-to-day activities? Use stairs instead of an elevator. Take a walk during your lunch break. If you drive, park your car farther away from your work or school. If you take public transportation, get off one stop early and walk the rest of the way. Stand up or walk around during all of your indoor phone calls. Get up, stretch, and walk around every 30 minutes throughout the day. Enjoy exercise with a friend. Support to continue exercising will help you keep a regular routine of activity. Where to find more information You can find more information about exercising to stay healthy from: U.S. Department of Health and Human Services: ThisPath.fi Centers for Disease Control and Prevention (  CDC): FootballExhibition.com.br Summary Exercising regularly is  important. It will improve your overall fitness, flexibility, and endurance. Regular exercise will also improve your overall health. It can help you control your weight, reduce stress, and improve your bone density. Do not exercise so much that you hurt yourself, feel dizzy, or get very short of breath. Before you start a new exercise program, talk with your health care provider. This information is not intended to replace advice given to you by your health care provider. Make sure you discuss any questions you have with your health care provider. Document Revised: 03/15/2021 Document Reviewed: 03/15/2021 Elsevier Patient Education  2024 [employer].

## 2024-08-25 NOTE — Addendum Note (Signed)
 Addended by: Lennell Shanks, MARY-MARGARET on: 08/25/2024 03:27 PM   Modules accepted: Orders

## 2024-08-25 NOTE — Progress Notes (Signed)
 Subjective:    Patient ID: Melissa Gilbert, female    DOB: 1956/07/03, 68 y.o.   MRN: [id number]   Chief Complaint: medical management of chronic issues     HPI:  Melissa Gilbert is a 68 y.o. who identifies as a female who was assigned female at birth.   Social history: Lives with: husband Work history: painter   Comes in today for follow up of the following chronic medical issues:  1. Mixed hyperlipidemia Does not really watch diet very closely and does no dedicated exercise Lab Results  Component Value Date   CHOL 203 (H) 07/19/2024   HDL 83 07/19/2024   LDLCALC 102 (H) 07/19/2024   TRIG 102 07/19/2024   CHOLHDL 2.4 07/19/2024     2. Hypothyroidism due to acquired atrophy of thyroid  No issues that she is aware of Lab Results  Component Value Date   TSH 3.220 07/19/2024     3. Gastroesophageal reflux disease without esophagitis Uses OTC meds when needed  4. 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 She is doing well. Had follow up with surgeon 08/17/24.  Allergies  Allergen Reactions   Paxlovid  [Nirmatrelvir -Ritonavir ]    Outpatient Encounter Medications as of 08/25/2024  Medication Sig   albuterol  (VENTOLIN  HFA) 108 (90 Base) MCG/ACT inhaler Inhale into the lungs.   Black Cohosh 40 MG CAPS Take 40 mg by mouth 3 (three) times daily.   Cholecalciferol (VITAMIN D3) 2000 UNITS capsule Take 2,000 Units by mouth daily.   conjugated estrogens  (PREMARIN ) vaginal cream INSERT (1) APPLICATORFUL INTO THE VAGINA EVERY DAY -FOR VAGINAL USE-   fexofenadine  (ALLEGRA ) 60 MG tablet Take 60 mg by mouth 2 (two) times daily.   fluticasone  (FLONASE ) 50 MCG/ACT nasal spray Place  2 sprays into both nostrils daily.   levocetirizine (XYZAL ) 5 MG tablet Take 1 tablet (5 mg total) by mouth every evening.   levothyroxine  (SYNTHROID ) 125 MCG tablet TAKE 1 TABLET DAILY BEFORE BREAKFAST   lidocaine  (XYLOCAINE ) 2 % solution Use as directed 15 mLs in the mouth or throat as needed for mouth pain.   linaclotide  (LINZESS ) 145 MCG CAPS capsule Take 1 capsule (145 mcg total) by mouth daily before breakfast.   Multiple Vitamin (MULTIVITAMIN) capsule Take 1 capsule by mouth daily.   Omega-3 Fatty Acids (FISH OIL) 1000 MG CAPS Take by mouth.   rosuvastatin  (CRESTOR ) 10 MG tablet TAKE 1 TABLET DAILY   S-Adenosylmethionine (SAM-E PO) Take by mouth.   triamcinolone  cream (KENALOG ) 0.1 % Apply 1 application topically 2 (two) times daily.   varenicline  (CHANTIX ) 1 MG tablet TAKE ONE TABLET TWICE DAILY   No facility-administered encounter medications on file as of 08/25/2024.    Past Surgical History:  Procedure Laterality Date   EYE SURGERY     HAMMER TOE SURGERY     RIGHT EYE SURGERY     TUBAL LIGATION      Family History  Problem Relation Age of Onset   Alzheimer's disease Mother    Hypothyroidism Mother    Heart disease Father    Cancer Father        prostate cancer   Hyperlipidemia Father  Hypothyroidism Sister    Breast cancer Neg Hx       Controlled substance contract: n/a     Review of Systems  Constitutional:  Negative for diaphoresis.  Eyes:  Negative for pain.  Respiratory:  Negative for shortness of breath.   Cardiovascular:  Negative for chest pain, palpitations and leg swelling.  Gastrointestinal:  Negative for abdominal pain.  Endocrine: Negative for polydipsia.  Skin:  Negative for rash.  Neurological:  Negative for dizziness, weakness and headaches.  Hematological:  Does not bruise/bleed easily.  All other systems reviewed and are negative.      Objective:   Physical Exam Vitals and nursing note reviewed.  Constitutional:      General:  She is not in acute distress.    Appearance: Normal appearance. She is well-developed.  HENT:     Head: Normocephalic.     Right Ear: Tympanic membrane normal.     Left Ear: Tympanic membrane normal.     Nose: Nose normal.     Mouth/Throat:     Mouth: Mucous membranes are moist.  Eyes:     Pupils: Pupils are equal, round, and reactive to light.  Neck:     Vascular: No carotid bruit or JVD.  Cardiovascular:     Rate and Rhythm: Normal rate and regular rhythm.     Heart sounds: Normal heart sounds.  Pulmonary:     Effort: Pulmonary effort is normal. No respiratory distress.     Breath sounds: Normal breath sounds. No wheezing or rales.  Chest:     Chest wall: No tenderness.  Abdominal:     General: Bowel sounds are normal. There is no distension or abdominal bruit.     Palpations: Abdomen is soft. There is no hepatomegaly, splenomegaly, mass or pulsatile mass.     Tenderness: There is no abdominal tenderness.  Musculoskeletal:        General: Normal range of motion.     Cervical back: Normal range of motion and neck supple.  Lymphadenopathy:     Cervical: No cervical adenopathy.  Skin:    General: Skin is warm and dry.  Neurological:     Mental Status: She is alert and oriented to person, place, and time.     Deep Tendon Reflexes: Reflexes are normal and symmetric.  Psychiatric:        Behavior: Behavior normal.        Thought Content: Thought content normal.        Judgment: Judgment normal.    BP 119/79   Pulse 78   Temp 97.7 F (36.5 C) (Temporal)   Ht 5' 9 (1.753 m)   Wt 196 lb (88.9 kg)   SpO2 96%   BMI 28.94 kg/m          Assessment & Plan:   Melissa Gilbert comes in today with chief complaint of medical management of chronic issues    Diagnosis and orders addressed:  1. Mixed hyperlipidemia Low fat diet - rosuvastatin  (CRESTOR ) 10 MG tablet; Take 1 tablet (10 mg total) by mouth daily.  Dispense: 90 tablet; Refill: 1 - CBC with  Differential/Platelet - Lipid panel - CMP14+EGFR  2. Hypothyroidism due to acquired atrophy of thyroid  Labs pending - levothyroxine  (SYNTHROID ) 125 MCG tablet; Take 1 tablet (125 mcg total) by mouth daily before breakfast.  Dispense: 90 tablet; Refill: 1 - Thyroid  Panel With TSH  3. Gastroesophageal reflux disease without esophagitis Avoid spicy foods Do not eat 2 hours prior to  bedtime  4. BMI 27.0-27.9,adult Discussed diet and exercise for person with BMI >25 Will recheck weight in 3-6 months    Labs pending Health Maintenance reviewed Diet and exercise encouraged  Follow up plan: 6 months   Mary-Margaret Gladis, FNP

## 2024-08-26 ENCOUNTER — Other Ambulatory Visit: Payer: Self-pay

## 2024-08-26 ENCOUNTER — Other Ambulatory Visit: Payer: Self-pay | Admitting: Family Medicine

## 2024-08-26 ENCOUNTER — Encounter: Payer: Self-pay | Admitting: Nurse Practitioner

## 2024-08-26 ENCOUNTER — Ambulatory Visit: Admitting: Nurse Practitioner

## 2024-08-26 VITALS — BP 118/78 | HR 62 | Temp 97.5°F | Ht 69.0 in | Wt 193.8 lb

## 2024-08-26 DIAGNOSIS — R3989 Other symptoms and signs involving the genitourinary system: Secondary | ICD-10-CM

## 2024-08-26 DIAGNOSIS — E782 Mixed hyperlipidemia: Secondary | ICD-10-CM

## 2024-08-26 DIAGNOSIS — E034 Atrophy of thyroid (acquired): Secondary | ICD-10-CM

## 2024-08-26 DIAGNOSIS — Z0000 Encounter for general adult medical examination without abnormal findings: Secondary | ICD-10-CM

## 2024-08-26 LAB — URINALYSIS, ROUTINE W REFLEX MICROSCOPIC
Bilirubin, UA: NEGATIVE
Glucose, UA: NEGATIVE
Leukocytes,UA: NEGATIVE
Nitrite, UA: NEGATIVE
Protein,UA: NEGATIVE
RBC, UA: NEGATIVE
Specific Gravity, UA: <=1.005 — AB (ref 1.005–1.030)
Urobilinogen, Ur: 0.2 mg/dL (ref 0.2–1.0)
pH, UA: 5.5 (ref 5.0–7.5)

## 2024-08-26 LAB — MICROSCOPIC EXAMINATION
Bacteria, UA: NONE SEEN
Epithelial Cells (non renal): NONE SEEN /HPF (ref 0–10)
WBC, UA: NONE SEEN /HPF (ref 0–5)

## 2024-08-26 LAB — LIPID PANEL

## 2024-08-26 NOTE — Patient Instructions (Signed)
What are Advance Directives? ?A living will allows you to document your wishes concerning medical treatments at the end of life.  ? ?Before your living will can guide medical decision-making two physicians must certify: ?You are unable to make medical decisions,  ?You are in the medical condition specified in the state's living will law (such as "terminal illness" or "permanent unconsciousness"),  ?Other requirements also may apply, depending upon the state. ?A medical power of attorney (or healthcare proxy) allows you to appoint a person you trust as your healthcare agent (or surrogate decision maker), who is authorized to make medical decisions on your behalf.  ? ?Before a medical power of attorney goes into effect a person?s physician must conclude that they are unable to make their own medical decisions. In addition: ?If a person regains the ability to make decisions, the agent cannot continue to act on the person's behalf.  ?Many states have additional requirements that apply only to decisions about life-sustaining medical treatments.  ?For example, before your agent can refuse a life-sustaining treatment on your behalf, a second physician may have to confirm your doctor's assessment that you are incapable of making treatment decisions. ?What Else Do I Need to Know?  ?Advance directives are legally valid throughout the [place]. While you do not need a lawyer to fill out an advance directive, your advance directive becomes legally valid as soon as you sign them in front of the required witnesses. The laws governing advance directives vary from state to state, so it is important to complete and sign advance directives that comply with your state's law. Also, advance directives can have different titles in different states.  ?Emergency medical technicians cannot honor living wills or medical powers of attorney. Once emergency personnel have been called, they must do what is necessary to stabilize a person  for transfer to a hospital, both from accident sites and from a home or other facility. After a physician fully evaluates the person's condition and determines the underlying conditions, advance directives can be implemented.  ?One state?s advance directive does not always work in another state. Some states do honor advance directives from another state; others will honor out-of-state advance directives as long as they are similar to the state's own law; and some states do not have an answer to this question. The best solution is if you spend a significant amount of time in more than one state, you should complete the advance directives for all the states you spend a significant amount of time in.  ?Advance directives do not expire. An advance directive remains in effect until you change it. If you complete a new advance directive, it invalidates the previous one.  ?You should review your advance directives periodically to ensure that they still reflect your wishes. If you want to change anything in an advance directive once you have completed it, you should complete a whole new document. ?[place] ? ? ? ?[facility], www.nhpco.org ? ?

## 2024-08-26 NOTE — Progress Notes (Signed)
 Subjective:    Melissa Gilbert is a 68 y.o. female who presents for a Welcome to Medicare exam.   Cardiac Risk Factors include: none      Objective:    Today's Vitals   08/26/24 1459  BP: 118/78  Pulse: 62  Temp: (!) 97.5 F (36.4 C)  SpO2: 98%  Weight: 193 lb 12.8 oz (87.9 kg)  Height: 5' 9 (1.753 m)  Body mass index is 28.62 kg/m.  Medications Outpatient Encounter Medications as of 08/26/2024  Medication Sig   Black Cohosh 40 MG CAPS Take 40 mg by mouth 3 (three) times daily.   Cholecalciferol (VITAMIN D3) 2000 UNITS capsule Take 2,000 Units by mouth daily.   conjugated estrogens  (PREMARIN ) vaginal cream INSERT (1) APPLICATORFUL INTO THE VAGINA EVERY DAY -FOR VAGINAL USE-   fexofenadine  (ALLEGRA ) 60 MG tablet Take 60 mg by mouth 2 (two) times daily.   fluticasone  (FLONASE ) 50 MCG/ACT nasal spray Place 2 sprays into both nostrils daily.   levocetirizine (XYZAL ) 5 MG tablet Take 1 tablet (5 mg total) by mouth every evening.   levothyroxine  (SYNTHROID ) 125 MCG tablet TAKE 1 TABLET DAILY BEFORE BREAKFAST   lidocaine  (XYLOCAINE ) 2 % solution Use as directed 15 mLs in the mouth or throat as needed for mouth pain.   linaclotide  (LINZESS ) 145 MCG CAPS capsule Take 1 capsule (145 mcg total) by mouth daily before breakfast.   Multiple Vitamin (MULTIVITAMIN) capsule Take 1 capsule by mouth daily.   Omega-3 Fatty Acids (FISH OIL) 1000 MG CAPS Take by mouth.   rosuvastatin  (CRESTOR ) 10 MG tablet TAKE 1 TABLET DAILY   S-Adenosylmethionine (SAM-E PO) Take by mouth.   triamcinolone  cream (KENALOG ) 0.1 % Apply 1 application topically 2 (two) times daily.   varenicline  (CHANTIX ) 1 MG tablet TAKE ONE TABLET TWICE DAILY   albuterol  (VENTOLIN  HFA) 108 (90 Base) MCG/ACT inhaler Inhale into the lungs.   No facility-administered encounter medications on file as of 08/26/2024.     History: Past Medical History:  Diagnosis Date   Allergy    Hyperlipidemia    Thyroid  disease    Past  Surgical History:  Procedure Laterality Date   EYE SURGERY     HAMMER TOE SURGERY     RIGHT EYE SURGERY     TUBAL LIGATION      Family History  Problem Relation Age of Onset   Alzheimer's disease Mother    Hypothyroidism Mother    Heart disease Father    Cancer Father        prostate cancer   Hyperlipidemia Father    Hypothyroidism Sister    Breast cancer Neg Hx    Social History   Occupational History   Not on file  Tobacco Use   Smoking status: Former    Types: Cigarettes    Passive exposure: Past   Smokeless tobacco: Never  Vaping Use   Vaping status: Never Used  Substance and Sexual Activity   Alcohol use: No   Drug use: No   Sexual activity: Yes    Birth control/protection: None    Tobacco Counseling Counseling given: No   Immunizations and Health Maintenance Immunization History  Administered Date(s) Administered   Influenza-Unspecified 10/16/2010   Moderna Sars-Covid-2 Vaccination 02/16/2020, 03/16/2020   Tdap 08/13/2019   Health Maintenance Due  Topic Date Due   Mammogram  10/04/2024    Activities of Daily Living    08/26/2024    3:05 PM 08/22/2024    4:37 PM  In  your present state of health, do you have any difficulty performing the following activities:  Hearing? 0 0  Vision? 0 0  Difficulty concentrating or making decisions? 0 0  Walking or climbing stairs? 0 0  Dressing or bathing? 0 0  Doing errands, shopping? 0 0  Preparing Food and eating ? N N  Using the Toilet? N N  In the past six months, have you accidently leaked urine? N N  Do you have problems with loss of bowel control? N N  Managing your Medications? N N  Managing your Finances? N N  Housekeeping or managing your Housekeeping? N N    Physical Exam   Ne done other than EKG- NSR-Mary-Margaret Gladis, FNP    Advanced Directives:    EKG:  normal EKG, normal sinus rhythm, unchanged from previous tracings      Assessment:    This is a routine wellness examination  for this patient . Welcome  to medicare  Vision/Hearing screen No results found.   Goals      DIET - INCREASE WATER INTAKE        Depression Screen    08/25/2024    3:10 PM 02/22/2024    2:57 PM 02/11/2024   11:29 AM 12/25/2023    9:02 AM  PHQ 2/9 Scores  PHQ - 2 Score 0 0 0 0     Fall Risk    08/26/2024    3:13 PM  Fall Risk   Falls in the past year? 0  Number falls in past yr: 0  Injury with Fall? 0  Risk for fall due to : No Fall Risks  Follow up Falls evaluation completed    Cognitive Function:        Patient Care Team: Gladis Mustard, FNP as PCP - General (Nurse Practitioner) Ob/Gyn, Landy Stains     Plan:   Welcome to Medicare  I have personally reviewed and noted the following in the patient's chart:   Medical and social history Use of alcohol, tobacco or illicit drugs  Current medications and supplements including opioid prescriptions. Patient is not currently taking opioid prescriptions. Functional ability and status Nutritional status Physical activity Advanced directives List of other physicians Hospitalizations, surgeries, and ER visits in previous 12 months Vitals Screenings to include cognitive, depression, and falls Referrals and appointments  In addition, I have reviewed and discussed with patient certain preventive protocols, quality metrics, and best practice recommendations. A written personalized care plan for preventive services as well as general preventive health recommendations were provided to patient.     Mary-Margaret Demary, [place] 08/26/2024

## 2024-08-27 LAB — CBC WITH DIFFERENTIAL/PLATELET
Basophils Absolute: 0 x10E3/uL (ref 0.0–0.2)
Basos: 1 %
EOS (ABSOLUTE): 0.1 x10E3/uL (ref 0.0–0.4)
Eos: 2 %
Hematocrit: 39.1 % (ref 34.0–46.6)
Hemoglobin: 12.6 g/dL (ref 11.1–15.9)
Immature Grans (Abs): 0 x10E3/uL (ref 0.0–0.1)
Immature Granulocytes: 0 %
Lymphocytes Absolute: 2 x10E3/uL (ref 0.7–3.1)
Lymphs: 41 %
MCH: 30.1 pg (ref 26.6–33.0)
MCHC: 32.2 g/dL (ref 31.5–35.7)
MCV: 93 fL (ref 79–97)
Monocytes Absolute: 0.3 x10E3/uL (ref 0.1–0.9)
Monocytes: 6 %
Neutrophils Absolute: 2.4 x10E3/uL (ref 1.4–7.0)
Neutrophils: 50 %
Platelets: 212 x10E3/uL (ref 150–450)
RBC: 4.19 x10E6/uL (ref 3.77–5.28)
RDW: 13 % (ref 11.7–15.4)
WBC: 4.8 x10E3/uL (ref 3.4–10.8)

## 2024-08-27 LAB — CMP14+EGFR
ALT: 7 IU/L (ref 0–32)
AST: 18 IU/L (ref 0–40)
Albumin: 4.9 g/dL (ref 3.9–4.9)
Alkaline Phosphatase: 80 IU/L (ref 49–135)
BUN/Creatinine Ratio: 16 (ref 12–28)
BUN: 16 mg/dL (ref 8–27)
Bilirubin Total: 0.3 mg/dL (ref 0.0–1.2)
CO2: 22 mmol/L (ref 20–29)
Calcium: 9.8 mg/dL (ref 8.7–10.3)
Chloride: 103 mmol/L (ref 96–106)
Creatinine, Ser: 1 mg/dL (ref 0.57–1.00)
Globulin, Total: 2 g/dL (ref 1.5–4.5)
Glucose: 92 mg/dL (ref 70–99)
Potassium: 4.4 mmol/L (ref 3.5–5.2)
Sodium: 142 mmol/L (ref 134–144)
Total Protein: 6.9 g/dL (ref 6.0–8.5)
eGFR: 63 mL/min/1.73 (ref >59)

## 2024-08-27 LAB — THYROID PANEL WITH TSH
Free Thyroxine Index: 2.5 (ref 1.2–4.9)
T3 Uptake Ratio: 26 % (ref 24–39)
T4, Total: 9.6 ug/dL (ref 4.5–12.0)
TSH: 3.04 u[IU]/mL (ref 0.450–4.500)

## 2024-08-27 LAB — LIPID PANEL
Chol/HDL Ratio: 2.8 ratio (ref 0.0–4.4)
Cholesterol, Total: 210 mg/dL — ABNORMAL HIGH (ref 100–199)
HDL: 76 mg/dL (ref >39)
LDL Chol Calc (NIH): 123 mg/dL — ABNORMAL HIGH (ref 0–99)
Triglycerides: 63 mg/dL (ref 0–149)
VLDL Cholesterol Cal: 11 mg/dL (ref 5–40)

## 2024-08-28 LAB — URINE CULTURE

## 2024-08-29 ENCOUNTER — Ambulatory Visit: Payer: Self-pay | Admitting: Nurse Practitioner

## 2024-08-29 ENCOUNTER — Other Ambulatory Visit: Payer: Self-pay

## 2024-08-29 ENCOUNTER — Ambulatory Visit: Payer: Self-pay | Admitting: Family Medicine

## 2024-08-29 ENCOUNTER — Encounter: Payer: Self-pay | Admitting: Family Medicine

## 2024-08-29 ENCOUNTER — Telehealth: Admitting: Family Medicine

## 2024-08-29 DIAGNOSIS — R3989 Other symptoms and signs involving the genitourinary system: Secondary | ICD-10-CM

## 2024-08-29 MED ORDER — CEPHALEXIN 500 MG PO CAPS: 500.0000 mg | ORAL_CAPSULE | Freq: Four times a day (QID) | ORAL | 0 refills | Status: AC

## 2024-08-29 NOTE — Progress Notes (Addendum)
 Virtual Visit via MyChart video note  I connected with Melissa Gilbert on 08/29/24 at 1330 by video and verified that I am speaking with the correct person using two identifiers. Melissa Gilbert is currently located at home and patient are currently with her during visit. The provider, Fonda LABOR Woodruff Skirvin, MD is located in their office at time of visit.  Call ended at 1336  I discussed the limitations, risks, security and privacy concerns of performing an evaluation and management service by video and the availability of in person appointments. I also discussed with the patient that there may be a patient responsible charge related to this service. The patient expressed understanding and agreed to proceed.   History and Present Illness:  Discussed the use of AI scribe software for clinical note transcription with the patient, who gave verbal consent to proceed.  History of Present Illness      Patient is calling in for urinary symptoms.  She does have burning and frequency.  She denies blood in urine and started today.  She denies fevers or chills or flank pain.  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         Outpatient Encounter Medications as of 08/29/2024  Medication Sig   albuterol  (VENTOLIN  HFA) 108 (90 Base) MCG/ACT inhaler Inhale into the lungs.   Black Cohosh 40 MG CAPS Take 40 mg by mouth 3 (three) times daily.   Cholecalciferol (VITAMIN D3) 2000 UNITS capsule Take 2,000 Units by mouth daily.   conjugated estrogens  (PREMARIN ) vaginal cream INSERT (1) APPLICATORFUL INTO THE VAGINA EVERY DAY -FOR VAGINAL USE-   fexofenadine  (ALLEGRA ) 60 MG tablet Take 60 mg by mouth 2 (two) times daily.   fluticasone  (FLONASE ) 50 MCG/ACT nasal spray Place 2 sprays into both nostrils daily.   levocetirizine (XYZAL ) 5 MG tablet Take 1 tablet (5 mg total) by mouth every evening.   levothyroxine  (SYNTHROID ) 125 MCG tablet TAKE 1 TABLET DAILY BEFORE BREAKFAST   lidocaine  (XYLOCAINE )  2 % solution Use as directed 15 mLs in the mouth or throat as needed for mouth pain.   linaclotide  (LINZESS ) 145 MCG CAPS capsule Take 1 capsule (145 mcg total) by mouth daily before breakfast.   Multiple Vitamin (MULTIVITAMIN) capsule Take 1 capsule by mouth daily.   Omega-3 Fatty Acids (FISH OIL) 1000 MG CAPS Take by mouth.   rosuvastatin  (CRESTOR ) 10 MG tablet TAKE 1 TABLET DAILY   S-Adenosylmethionine (SAM-E PO) Take by mouth.   triamcinolone  cream (KENALOG ) 0.1 % Apply 1 application topically 2 (two) times daily.   varenicline  (CHANTIX ) 1 MG tablet TAKE ONE TABLET TWICE DAILY   No facility-administered encounter medications on file as of 08/29/2024.    Review of Systems  Constitutional:  Negative for chills and fever.  Eyes:  Negative for visual disturbance.  Respiratory:  Negative for chest tightness and shortness of breath.   Cardiovascular:  Negative for chest pain and leg swelling.  Gastrointestinal:  Negative for abdominal pain.  Genitourinary:  Positive for dysuria, frequency and urgency. Negative for difficulty urinating, hematuria, vaginal bleeding, vaginal discharge and vaginal pain.  Musculoskeletal:  Negative for back pain and gait problem.  Skin:  Negative for rash.  Neurological:  Negative for light-headedness and headaches.  Psychiatric/Behavioral:  Negative for agitation and behavioral problems.   All other systems reviewed and are negative.   Observations/Objective: Patient is comfortable and in no acute distress  Assessment and Plan: Problem List Items Addressed This Visit  None Visit Diagnoses       Suspected urinary tract infection    -  Primary   Relevant Orders   Urinalysis, Complete   Urine Culture            Patient is coming in to leave a urine sample and based off those results we will treat accordingly.  She has had recurrent UTIs in the past.      Patient's urinalysis showed 2+ blood 1+ leukocytes positive for nitrites we will treat like  UTI with Keflex     Follow up plan: Return if symptoms worsen or fail to improve.     I discussed the assessment and treatment plan with the patient. The patient was provided an opportunity to ask questions and all were answered. The patient agreed with the plan and demonstrated an understanding of the instructions.   The patient was advised to call back or seek an in-person evaluation if the symptoms worsen or if the condition fails to improve as anticipated.  The above assessment and management plan was discussed with the patient. The patient verbalized understanding of and has agreed to the management plan. Patient is aware to call the clinic if symptoms persist or worsen. Patient is aware when to return to the clinic for a follow-up visit. Patient educated on when it is appropriate to go to the emergency department.    I provided 6 minutes of non-face-to-face time during this encounter.    Fonda DELENA Levins, MD

## 2024-08-29 NOTE — Addendum Note (Signed)
 Addended by: MARYANNE CHEW on: 08/29/2024 04:35 PM   Modules accepted: Orders

## 2024-08-30 LAB — URINALYSIS, COMPLETE
Bilirubin, UA: NEGATIVE
Glucose, UA: NEGATIVE
Ketones, UA: NEGATIVE
Nitrite, UA: POSITIVE — AB
Protein,UA: NEGATIVE
Specific Gravity, UA: 1.01 (ref 1.005–1.030)
Urobilinogen, Ur: 0.2 mg/dL (ref 0.2–1.0)
pH, UA: 6 (ref 5.0–7.5)

## 2024-09-01 LAB — URINE CULTURE

## 2024-09-02 ENCOUNTER — Ambulatory Visit: Payer: Self-pay | Admitting: Family Medicine

## 2024-09-07 ENCOUNTER — Telehealth: Payer: Self-pay

## 2024-09-07 NOTE — Telephone Encounter (Signed)
  Called and spoke with patient and she said its some labs she needs to have done for [facility]. Says [facility] usually takes care of this for her and requested that I send this message to [place]. Patient aware that Lorn will be back in the office tomorrow to address.   Copied from CRM #[id number]. Topic: Clinical - Lab/Test Results >> Sep 07, 2024  1:12 PM Delon DASEN wrote: Reason for CRM: need orders for labs- please call [phone]

## 2024-09-08 ENCOUNTER — Other Ambulatory Visit: Payer: Self-pay

## 2024-09-08 ENCOUNTER — Other Ambulatory Visit

## 2024-09-08 DIAGNOSIS — D3500 Benign neoplasm of unspecified adrenal gland: Secondary | ICD-10-CM

## 2024-09-08 NOTE — Telephone Encounter (Signed)
 Called and spoke with patient. She is needing another set of labs like before from her specialist. Lab appt made for today and she will bring orders with her

## 2024-09-15 LAB — METANEPHRINES, PLASMA
Metanephrine, Free: <25 pg/mL (ref 0.0–88.0)
Normetanephrine, Free: 91.5 pg/mL (ref 0.0–285.2)

## 2024-09-21 ENCOUNTER — Other Ambulatory Visit: Payer: Self-pay | Admitting: Nurse Practitioner

## 2024-09-21 DIAGNOSIS — E782 Mixed hyperlipidemia: Secondary | ICD-10-CM

## 2024-09-26 ENCOUNTER — Telehealth: Payer: Self-pay

## 2024-09-26 NOTE — Telephone Encounter (Signed)
 N/a

## 2024-09-29 ENCOUNTER — Other Ambulatory Visit: Payer: Self-pay | Admitting: Nurse Practitioner

## 2024-09-29 DIAGNOSIS — Z1231 Encounter for screening mammogram for malignant neoplasm of breast: Secondary | ICD-10-CM

## 2024-10-13 ENCOUNTER — Other Ambulatory Visit: Payer: Self-pay | Admitting: *Deleted

## 2024-10-13 DIAGNOSIS — E034 Atrophy of thyroid (acquired): Secondary | ICD-10-CM

## 2024-10-26 ENCOUNTER — Ambulatory Visit
Admission: RE | Admit: 2024-10-26 | Discharge: 2024-10-26 | Disposition: A | Discharge status: Home / Self Care | Source: Ambulatory Visit | Attending: Nurse Practitioner | Admitting: Nurse Practitioner

## 2024-10-26 DIAGNOSIS — Z1231 Encounter for screening mammogram for malignant neoplasm of breast: Secondary | ICD-10-CM

## 2024-11-18 NOTE — Telephone Encounter (Signed)
Can you help him with this?

## 2024-11-22 ENCOUNTER — Ambulatory Visit: Payer: Self-pay | Admitting: Nurse Practitioner

## 2024-11-22 ENCOUNTER — Telehealth: Admitting: Nurse Practitioner

## 2024-11-22 ENCOUNTER — Encounter: Payer: Self-pay | Admitting: Nurse Practitioner

## 2024-11-22 DIAGNOSIS — R399 Unspecified symptoms and signs involving the genitourinary system: Secondary | ICD-10-CM | POA: Insufficient documentation

## 2024-11-22 DIAGNOSIS — N3000 Acute cystitis without hematuria: Secondary | ICD-10-CM

## 2024-11-22 DIAGNOSIS — R300 Dysuria: Secondary | ICD-10-CM

## 2024-11-22 LAB — MICROSCOPIC EXAMINATION
Epithelial Cells (non renal): NONE SEEN /HPF (ref 0–10)
RBC, Urine: NONE SEEN /HPF (ref 0–2)
Renal Epithel, UA: NONE SEEN /HPF
Yeast, UA: NONE SEEN

## 2024-11-22 LAB — URINALYSIS, ROUTINE W REFLEX MICROSCOPIC
Bilirubin, UA: NEGATIVE
Nitrite, UA: POSITIVE — AB
Protein,UA: NEGATIVE
Urobilinogen, Ur: 0.2 mg/dL (ref 0.2–1.0)
pH, UA: 5.5 (ref 5.0–7.5)

## 2024-11-22 MED ORDER — SULFAMETHOXAZOLE-TRIMETHOPRIM 800-160 MG PO TABS: 1.0000 | ORAL_TABLET | Freq: Two times a day (BID) | ORAL | 0 refills | Status: AC

## 2024-11-22 NOTE — Addendum Note (Signed)
 Addended by: DEITRA MORTON SEBASTIAN NENA on: 11/22/2024 04:20 PM   Modules accepted: Orders

## 2024-11-22 NOTE — Progress Notes (Addendum)
 "    Virtual Visit via video Note Due to COVID-19 pandemic this visit was conducted virtually. This visit type was conducted due to national recommendations for restrictions regarding the COVID-19 Pandemic (e.g. social distancing, sheltering in place) in an effort to limit this patient's exposure and mitigate transmission in our community. All issues noted in this document were discussed and addressed.  A physical exam was not performed with this format.   I connected with Melissa Gilbert on 11/22/2024 at 1158 by Name and DOB and verified that I am speaking with the correct person using two identifiers. Melissa Gilbert is currently located at home during visit. The provider, Nena Deitra Morton Sebastian, DNP is located in their office at time of visit.  I discussed the limitations, risks, security and privacy concerns of performing an evaluation and management service by virtual visit and the availability of in person appointments. I also discussed with the patient that there may be a patient responsible charge related to this service. The patient expressed understanding and agreed to proceed.  Subjective:  Patient ID: Melissa Gilbert, female    DOB: 09-28-1956, 68 y.o.   MRN: [id number]  Chief Complaint:  Dysuria (Symptoms started this morning frequency)   HPI: Melissa Gilbert is a 68 y.o. female presenting on 11/22/2024 for Dysuria (Symptoms started this morning frequency)  The patient is a 68 year old female seen via telehealth for evaluation of dysuria. She reports symptom onset this morning, including painful urination. She presented to the clinic in [place] to provide a urine sample for analysis. Her last urinary tract infection was in September 2025. She took over-the-counter phenazopyridine  (Azo), which has provided some symptom relief. She denies additional symptoms at this time. Dysuria  Associated symptoms include frequency. Pertinent negatives include no chills, flank pain or nausea.      Relevant past medical, surgical, family, and social history reviewed and updated as indicated.  Allergies and medications reviewed and updated.   Past Medical History:  Diagnosis Date   Allergy    Hyperlipidemia    Thyroid  disease     Past Surgical History:  Procedure Laterality Date   EYE SURGERY     HAMMER TOE SURGERY     RIGHT EYE SURGERY     TUBAL LIGATION      Social History   Socioeconomic History   Marital status: Married    Spouse name: Not on file   Number of children: 2   Years of education: Not on file   Highest education level: Associate degree: occupational, scientist, product/process development, or vocational program  Occupational History   Not on file  Tobacco Use   Smoking status: Former    Types: Cigarettes    Passive exposure: Past   Smokeless tobacco: Never  Vaping Use   Vaping status: Never Used  Substance and Sexual Activity   Alcohol use: No   Drug use: No   Sexual activity: Yes    Birth control/protection: None  Other Topics Concern   Not on file  Social History Narrative   Not on file   Social Drivers of Health   Tobacco Use: Medium Risk (11/22/2024)   Patient History    Smoking Tobacco Use: Former    Smokeless Tobacco Use: Never    Passive Exposure: Past  Physicist, Medical Strain: Low Risk (08/22/2024)   Overall Financial Resource Strain (CARDIA)    Difficulty of Paying Living Expenses: Not hard at all  Food Insecurity: No Food Insecurity (08/22/2024)   Epic  Worried About Programme Researcher, Broadcasting/film/video in the Last Year: Never true    Ran Out of Food in the Last Year: Never true  Transportation Needs: No Transportation Needs (08/26/2024)   Epic    Lack of Transportation (Medical): No    Lack of Transportation (Non-Medical): No  Physical Activity: Sufficiently Active (08/22/2024)   Exercise Vital Sign    Days of Exercise per Week: 7 days    Minutes of Exercise per Session: 60 min  Stress: No Stress Concern Present (08/22/2024)   [employer] of  Occupational Health - Occupational Stress Questionnaire    Feeling of Stress: Not at all  Social Connections: Moderately Integrated (08/22/2024)   Social Connection and Isolation Panel    Frequency of Communication with Friends and Family: More than three times a week    Frequency of Social Gatherings with Friends and Family: More than three times a week    Attends Religious Services: More than 4 times per year    Active Member of Clubs or Organizations: No    Attends Banker Meetings: Not on file    Marital Status: Married  Intimate Partner Violence: Not At Risk (08/26/2024)   Epic    Fear of Current or Ex-Partner: No    Emotionally Abused: No    Physically Abused: No    Sexually Abused: No  Depression (PHQ2-9): Low Risk (08/26/2024)   Depression (PHQ2-9)    PHQ-2 Score: 0  Alcohol Screen: Low Risk (08/22/2024)   Alcohol Screen    Last Alcohol Screening Score (AUDIT): 1  Housing: Unknown (08/26/2024)   Epic    Unable to Pay for Housing in the Last Year: No    Number of Times Moved in the Last Year: Not on file    Homeless in the Last Year: No  Utilities: Not At Risk (07/26/2024)   Received from [facility]   Epic    In the past 12 months has the electric, gas, oil, or water company threatened to shut off services in your home?: No  Health Literacy: Adequate Health Literacy (08/26/2024)   B1300 Health Literacy    Frequency of need for help with medical instructions: Never    Outpatient Encounter Medications as of 11/22/2024  Medication Sig   albuterol  (VENTOLIN  HFA) 108 (90 Base) MCG/ACT inhaler Inhale into the lungs.   Black Cohosh 40 MG CAPS Take 40 mg by mouth 3 (three) times daily.   cephALEXin  (KEFLEX ) 500 MG capsule Take 1 capsule (500 mg total) by mouth 4 (four) times daily.   Cholecalciferol (VITAMIN D3) 2000 UNITS capsule Take 2,000 Units by mouth daily.   conjugated estrogens  (PREMARIN ) vaginal cream INSERT (1) APPLICATORFUL INTO THE VAGINA  EVERY DAY -FOR VAGINAL USE-   fexofenadine  (ALLEGRA ) 60 MG tablet Take 60 mg by mouth 2 (two) times daily.   fluticasone  (FLONASE ) 50 MCG/ACT nasal spray Place 2 sprays into both nostrils daily.   levocetirizine (XYZAL ) 5 MG tablet Take 1 tablet (5 mg total) by mouth every evening.   Levothyroxine  Sodium 125 MCG CAPS Take 1 capsule (125 mcg total) by mouth daily.   lidocaine  (XYLOCAINE ) 2 % solution Use as directed 15 mLs in the mouth or throat as needed for mouth pain.   linaclotide  (LINZESS ) 145 MCG CAPS capsule Take 1 capsule (145 mcg total) by mouth daily before breakfast.   Multiple Vitamin (MULTIVITAMIN) capsule Take 1 capsule by mouth daily.   Omega-3 Fatty Acids (FISH OIL) 1000 MG CAPS Take by  mouth.   rosuvastatin  (CRESTOR ) 10 MG tablet TAKE 1 TABLET DAILY   S-Adenosylmethionine (SAM-E PO) Take by mouth.   triamcinolone  cream (KENALOG ) 0.1 % Apply 1 application topically 2 (two) times daily.   varenicline  (CHANTIX ) 1 MG tablet TAKE ONE TABLET TWICE DAILY   No facility-administered encounter medications on file as of 11/22/2024.    Allergies[1]  Review of Systems  Constitutional:  Negative for chills and fever.  HENT:  Negative for congestion and sore throat.   Respiratory:  Negative for cough.   Cardiovascular:  Negative for chest pain and palpitations.  Gastrointestinal:  Positive for abdominal pain. Negative for diarrhea and nausea.       Pressure on pelvic area  Genitourinary:  Positive for dysuria and frequency. Negative for flank pain.  Neurological:  Negative for dizziness and headaches.         Observations/Objective: No vital signs or physical exam, this was a virtual health encounter.  Pt alert and oriented, answers all questions appropriately, and able to speak in full sentences.  Physical Exam HENT:     Head: Normocephalic and atraumatic.  Eyes:     Extraocular Movements: Extraocular movements intact.     Conjunctiva/sclera: Conjunctivae normal.      Pupils: Pupils are equal, round, and reactive to light.  Pulmonary:     Effort: Pulmonary effort is normal.  Neurological:     Mental Status: She is alert and oriented to person, place, and time.  Psychiatric:        Mood and Affect: Mood normal.        Behavior: Behavior normal.        Thought Content: Thought content normal.        Judgment: Judgment normal.     Urine dipstick shows positive for nitrates, positive for leukocytes, and positive for ketones.  Micro exam: 0-5 WBC's per HPF and few + bacteria.   Assessment and Plan: Meron Bocchino was seen today for dysuria.  Diagnoses and all orders for this visit:  UTI symptoms -     Urinalysis, Routine w reflex microscopic  Acute cystitis without hematuria   Harvey is a 68 year old Caucasian female seen today for acute  cystitis, no acute distress  Acute cystitis in a 68 year old female presenting with acute onset dysuria beginning this morning. History is notable for a prior urinary tract infection in September 2025. Partial symptom relief with over-the-counter phenazopyridine  (Azo).   Start trimethoprim -sulfamethoxazole  (Bactrim ) one tablet by mouth twice daily for 7 days.  urine culture ordered Adjust antibiotic therapy as indicated based on urine culture and sensitivity results.  Encourage increased oral fluid intake; may continue phenazopyridine  short-term for dysuria as needed.      Urinary Tract Infection, Female A urinary tract infection (UTI) is an infection in your urinary tract. The urinary tract is made up of organs that make, store, and get rid of pee (urine) in your body. These organs include: The kidneys. The ureters. The bladder. The urethra. What are the causes? Most UTIs are caused by germs called bacteria. They may be in or near your genitals. These germs grow and cause swelling in your urinary tract. What increases the risk? You're more likely to get a UTI if: You're a female. The urethra is shorter  in females than in males. You have a soft tube called a catheter that drains your pee. You can't control when you pee or poop. You have trouble peeing because of: A kidney stone. A urinary blockage. A nerve  condition that affects your bladder. Not getting enough to drink. You're sexually active. You use a birth control inside your vagina, like spermicide. You're pregnant. You have low levels of the hormone estrogen in your body. You're an older adult. You're also more likely to get a UTI if you have other health problems. These may include: Diabetes. A weak immune system. Your immune system is your body's defense system. Sickle cell disease. Injury of the spine. What are the signs or symptoms? Symptoms may include: Needing to pee right away. Peeing small amounts often. Pain or burning when you pee. Blood in your pee. Pee that smells bad or odd. Pain in your belly or lower back. You may also: Feel confused. This may be the first symptom in older adults. Vomit. Not feel hungry. Feel tired or easily annoyed. Have a fever or chills. How is this diagnosed? A UTI is diagnosed based on your medical history and an exam. You may also have other tests. These may include: Pee tests. Blood tests. Tests for sexually transmitted infections (STIs). If you've had more than one UTI, you may need to have imaging studies done to find out why you keep getting them. How is this treated? A UTI can be treated by: Taking antibiotics or other medicines. Drinking enough fluid to keep your pee pale yellow. In rare cases, a UTI can cause a very bad condition called sepsis. Sepsis may be treated in the hospital. Follow these instructions at home: Medicines Take your medicines only as told by your health care provider. If you were given antibiotics, take them as told by your provider. Do not stop taking them even if you start to feel better. General instructions Make sure you: Pee often and fully.  Do not hold your pee for a long time. Wipe from front to back after you pee or poop. Use each tissue only once when you wipe. Pee after you have sex. Do not douche or use sprays or powders in your genital area. Contact a health care provider if: Your symptoms don't get better after 1-2 days of taking antibiotics. Your symptoms go away and then come back. You have a fever or chills. You vomit or feel like you may vomit. Get help right away if: You have very bad pain in your back or lower belly. You faint. This information is not intended to replace advice given to you by your health care provider. Make sure you discuss any questions you have with your health care provider. Document Revised: 10/28/2023 Document Reviewed: 02/20/2023 Elsevier Patient Education  2025 [employer].    Follow Up Instructions: Return if symptoms worsen or fail to improve.    I discussed the assessment and treatment plan with the patient. The patient was provided an opportunity to ask questions and all were answered. The patient agreed with the plan and demonstrated an understanding of the instructions.   The patient was advised to call back or seek an in-person evaluation if the symptoms worsen or if the condition fails to improve as anticipated.  The above assessment and management plan was discussed with the patient. The patient verbalized understanding of and has agreed to the management plan. Patient is aware to call the clinic if they develop any new symptoms or if symptoms persist or worsen. Patient is aware when to return to the clinic for a follow-up visit. Patient educated on when it is appropriate to go to the emergency department.    I provided 12 minutes of time  during this video encounter.   Khamauri Bauernfeind [place] Thompson, DNP [facility] [address] [place], [place] [address] [phone] 11/22/2024     [1]  Allergies Allergen Reactions   Paxlovid   [Nirmatrelvir -Ritonavir ]    "

## 2025-02-20 ENCOUNTER — Other Ambulatory Visit: Payer: Self-pay

## 2025-02-21 ENCOUNTER — Ambulatory Visit: Payer: Self-pay | Admitting: Nurse Practitioner

## 2025-02-23 ENCOUNTER — Ambulatory Visit: Payer: Self-pay | Admitting: Nurse Practitioner
